# Patient Record
Sex: Male | Born: 1990 | Race: White | Hispanic: No | Marital: Single | Smoking: Never smoker
Health system: Southern US, Community
[De-identification: ages and names within clinical notes are randomized; demographics above are authoritative.]

## PROBLEM LIST (undated history)

## (undated) DIAGNOSIS — F840 Autistic disorder: Secondary | ICD-10-CM

## (undated) DIAGNOSIS — F419 Anxiety disorder, unspecified: Secondary | ICD-10-CM

## (undated) HISTORY — DX: Autistic disorder: F84.0

## (undated) HISTORY — DX: Anxiety disorder, unspecified: F41.9

---

## 2013-01-13 ENCOUNTER — Encounter: Payer: Self-pay | Admitting: Family Medicine

## 2013-01-13 ENCOUNTER — Ambulatory Visit (INDEPENDENT_AMBULATORY_CARE_PROVIDER_SITE_OTHER): Payer: Medicaid Other | Admitting: Family Medicine

## 2013-01-13 VITALS — BP 126/67 | HR 134 | Temp 98.0°F | Ht 70.5 in | Wt 204.0 lb

## 2013-01-13 DIAGNOSIS — H9201 Otalgia, right ear: Secondary | ICD-10-CM

## 2013-01-13 DIAGNOSIS — H9209 Otalgia, unspecified ear: Secondary | ICD-10-CM

## 2013-01-13 DIAGNOSIS — H6123 Impacted cerumen, bilateral: Secondary | ICD-10-CM

## 2013-01-13 DIAGNOSIS — H6120 Impacted cerumen, unspecified ear: Secondary | ICD-10-CM

## 2013-01-13 NOTE — Progress Notes (Signed)
  Subjective:    Patient ID: William Galvan, male    DOB: [birth year], 22 y.o.   MRN: [id number]  HPIpt here today for right ear pain and hard of hearing in right ear. He has had ear discomfort from the ear wax buildup. He comes in today with his grandmother.  There are no active problems to display for this patient.  Outpatient Encounter Prescriptions as of 01/13/2013  Medication Sig Dispense Refill  . ALPRAZolam (XANAX) 0.25 MG tablet Take 0.25 mg by mouth 3 (three) times daily as needed for sleep.       No facility-administered encounter medications on file as of 01/13/2013.       Review of Systems  HENT: Positive for ear pain (right- HOH).    patient denies fever or cough or sore throat     Objective:   Physical Exam  Vitals reviewed. Constitutional: He is oriented to person, place, and time. He appears well-developed and well-nourished. No distress.  HENT:  Head: Normocephalic and atraumatic.  Nose: Nose normal.  Mouth/Throat: Oropharynx is clear and moist. No oropharyngeal exudate.  Bilateral ear cerumen  Eyes: Conjunctivae are normal. Right eye exhibits no discharge. Left eye exhibits no discharge. No scleral icterus.  Neck: Normal range of motion. Neck supple. No thyromegaly present.  Musculoskeletal: Normal range of motion.  Lymphadenopathy:    He has no cervical adenopathy.  Neurological: He is alert and oriented to person, place, and time.  Skin: Skin is warm and dry. No rash noted. He is not diaphoretic.  Psychiatric: He has a normal mood and affect. His behavior is normal.   BP 126/67  Pulse 134  Temp(Src) 98 F (36.7 C) (Oral)  Ht 5' 10.5" (1.791 m)  Wt 204 lb (92.534 kg)  BMI 28.85 kg/m2 Wax was successfully removed with irrigation bilaterally and patient admitted to feeling much better. After removal of ear drops there was no sign of any ear infection or ear canal infection.       Assessment & Plan:    1. Excessive cerumen in ear canal, bilateral   2.  Otalgia of right ear    Patient Instructions  May use debrox ear drops, 3 drops nightly for 2-3 nights wait 1 week and repeat Using these drops in the ear will make it more likely that the wax will drain out instead of plugging the ears   Nyra Capes MD

## 2013-01-13 NOTE — Patient Instructions (Signed)
May use debrox ear drops, 3 drops nightly for 2-3 nights wait 1 week and repeat Using these drops in the ear will make it more likely that the wax will drain out instead of plugging the ears

## 2013-02-08 ENCOUNTER — Ambulatory Visit (INDEPENDENT_AMBULATORY_CARE_PROVIDER_SITE_OTHER): Payer: Medicaid Other

## 2013-02-08 DIAGNOSIS — Z23 Encounter for immunization: Secondary | ICD-10-CM

## 2013-08-25 ENCOUNTER — Encounter: Payer: Self-pay | Admitting: Family Medicine

## 2013-08-25 ENCOUNTER — Ambulatory Visit (INDEPENDENT_AMBULATORY_CARE_PROVIDER_SITE_OTHER): Payer: Medicaid Other | Admitting: Family Medicine

## 2013-08-25 VITALS — BP 118/74 | HR 113 | Temp 100.3°F | Ht 70.5 in | Wt 208.0 lb

## 2013-08-25 DIAGNOSIS — J069 Acute upper respiratory infection, unspecified: Secondary | ICD-10-CM

## 2013-08-25 MED ORDER — AZITHROMYCIN 250 MG PO TABS: ORAL_TABLET | ORAL | Status: DC

## 2013-08-25 NOTE — Progress Notes (Signed)
   Subjective:    Patient ID: William Galvan, male    DOB: 05/10/1990, 23 y.o.   MRN: [id number]  HPI  This 23 y.o. male presents for evaluation of left ear discomfort, decreased hearing acuity, and Low grade fever.  Review of Systems C/o decreased hearing acuity and uri sx's   No chest pain, SOB, HA, dizziness, vision change, N/V, diarrhea, constipation, dysuria, urinary urgency or frequency, myalgias, arthralgias or rash.  Objective:   Physical Exam Vital signs noted  Well developed well nourished male.  HEENT - Head atraumatic Normocephalic                Eyes - PERRLA, Conjuctiva - clear Sclera- Clear EOMI                Ears - EAC's with cerumen impaction.  After irrigation bilat EAC's clear and TM's wnl                Throat - oropharanx wnl Respiratory - Lungs CTA bilateral Cardiac - RRR S1 and S2 without murmur      Assessment & Plan:  URI (upper respiratory infection) - Plan: azithromycin (ZITHROMAX) 250 MG tablet  Push po fluids, rest, tylenol and motrin otc prn as directed for fever, arthralgias, and myalgias.  Follow up prn if sx's continue or persist.  Cerumen impaction - Ears irrigated and clear bilateral.  William CanterWilliam J Daiya Tamer FNP

## 2013-08-25 NOTE — Progress Notes (Signed)
Both ears irrigated until clear and cerumen removed with curette.  Patient tolerated well.

## 2013-11-11 ENCOUNTER — Telehealth: Payer: Self-pay | Admitting: Family Medicine

## 2013-11-11 NOTE — Telephone Encounter (Signed)
appt scheduled for tues with christy

## 2013-11-15 ENCOUNTER — Ambulatory Visit (INDEPENDENT_AMBULATORY_CARE_PROVIDER_SITE_OTHER): Payer: Medicaid Other | Admitting: Family

## 2013-11-15 ENCOUNTER — Encounter: Payer: Self-pay | Admitting: Family

## 2013-11-15 VITALS — BP 114/75 | HR 105 | Temp 98.2°F | Ht 70.5 in | Wt 208.8 lb

## 2013-11-15 DIAGNOSIS — F411 Generalized anxiety disorder: Secondary | ICD-10-CM | POA: Insufficient documentation

## 2013-11-15 MED ORDER — ALPRAZOLAM 0.25 MG PO TABS: 0.2500 mg | ORAL_TABLET | Freq: Three times a day (TID) | ORAL | Status: DC | PRN

## 2013-11-15 MED ORDER — ESCITALOPRAM OXALATE 10 MG PO TABS: 10.0000 mg | ORAL_TABLET | Freq: Every day | ORAL | Status: DC

## 2013-11-15 NOTE — Patient Instructions (Signed)
Stress and Stress Management Stress is a normal reaction to life events. It is what you feel when life demands more than you are used to or more than you can handle. Some stress can be useful. For example, the stress reaction can help you catch the last bus of the day, study for a test, or meet a deadline at work. But stress that occurs too often or for too long can cause problems. It can affect your emotional health and interfere with relationships and normal daily activities. Too much stress can weaken your immune system and increase your risk for physical illness. If you already have a medical problem, stress can make it worse. CAUSES  All sorts of life events may cause stress. An event that causes stress for one person may not be stressful for another person. Major life events commonly cause stress. These may be positive or negative. Examples include losing your job, moving into a new home, getting married, having a baby, or losing a loved one. Less obvious life events may also cause stress, especially if they occur day after day or in combination. Examples include working long hours, driving in traffic, caring for children, being in debt, or being in a difficult relationship. SIGNS AND SYMPTOMS Stress may cause emotional symptoms including, the following:  Anxiety. This is feeling worried, afraid, on edge, overwhelmed, or out of control.  Anger. This is feeling irritated or impatient.  Depression. This is feeling sad, down, helpless, or guilty.  Difficulty focusing, remembering, or making decisions. Stress may cause physical symptoms, including the following:   Aches and pains. These may affect your head, neck, back, stomach, or other areas of your body.  Tight muscles or clenched jaw.  Low energy or trouble sleeping. Stress may cause unhealthy behaviors, including the following:   Eating to feel better (overeating) or skipping meals.  Sleeping too little, too much, or both.  Working  too much or putting off tasks (procrastination).  Smoking, drinking alcohol, or using drugs to feel better. DIAGNOSIS  Stress is diagnosed through an assessment by your health care provider. Your health care provider will ask questions about your symptoms and any stressful life events.Your health care provider will also ask about your medical history and may order blood tests or other tests. Certain medical conditions and medicine can cause physical symptoms similar to stress. Mental illness can cause emotional symptoms and unhealthy behaviors similar to stress. Your health care provider may refer you to a mental health professional for further evaluation.  TREATMENT  Stress management is the recommended treatment for stress.The goals of stress management are reducing stressful life events and coping with stress in healthy ways.  Techniques for reducing stressful life events include the following:  Stress identification. Self-monitor for stress and identify what causes stress for you. These skills may help you to avoid some stressful events.  Time management. Set your priorities, keep a calendar of events, and learn to say "no." These tools can help you avoid making too many commitments. Techniques for coping with stress include the following:  Rethinking the problem. Try to think realistically about stressful events rather than ignoring them or overreacting. Try to find the positives in a stressful situation rather than focusing on the negatives.  Exercise. Physical exercise can release both physical and emotional tension. The key is to find a form of exercise you enjoy and do it regularly.  Relaxation techniques. These relax the body and mind. Examples include yoga, meditation, tai chi, biofeedback, deep  breathing, progressive muscle relaxation, listening to music, being out in nature, journaling, and other hobbies. Again, the key is to find one or more that you enjoy and can do  regularly.  Healthy lifestyle. Eat a balanced diet, get plenty of sleep, and do not smoke. Avoid using alcohol or drugs to relax.  Strong support network. Spend time with family, friends, or other people you enjoy being around.Express your feelings and talk things over with someone you trust. Counseling or talktherapy with a mental health professional may be helpful if you are having difficulty managing stress on your own. Medicine is typically not recommended for the treatment of stress.Talk to your health care provider if you think you need medicine for symptoms of stress. HOME CARE INSTRUCTIONS  Keep all follow-up visits as directed by your health care provider.  Take all medicines as directed by your health care provider. SEEK MEDICAL CARE IF:  Your symptoms get worse or you start having new symptoms.  You feel overwhelmed by your problems and can no longer manage them on your own. SEEK IMMEDIATE MEDICAL CARE IF:  You feel like hurting yourself or someone else. Document Released: 10/01/2000 Document Revised: 08/22/2013 Document Reviewed: 11/30/2012 Exit[employer] Patient Information 2015 [employer]. This information is not intended to replace advice given to you by your health care provider. Make sure you discuss any questions you have with your health care provider.  

## 2013-11-15 NOTE — Progress Notes (Signed)
   Subjective:    Patient ID: William Galvan, male    DOB: 08/02/1990, 23 y.o.   MRN: [id number]  Anxiety Symptoms include compulsions, excessive worry, irritability, nervous/anxious behavior, obsessions, panic, restlessness and shortness of breath. Patient reports no depressed mood, hyperventilation or palpitations. Symptoms occur occasionally. The severity of symptoms is interfering with daily activities and causing significant distress. The symptoms are aggravated by social activities, specific phobias and family issues.   His past medical history is significant for anxiety/panic attacks. There is no history of depression. Past treatments include benzodiazephines. The treatment provided mild relief.      Review of Systems  Constitutional: Positive for irritability.  HENT: Negative.   Respiratory: Positive for shortness of breath.   Cardiovascular: Negative.  Negative for palpitations.  Gastrointestinal: Negative.   Endocrine: Negative.   Genitourinary: Negative.   Musculoskeletal: Negative.   Neurological: Negative.   Hematological: Negative.   Psychiatric/Behavioral: The patient is nervous/anxious.   All other systems reviewed and are negative.      Objective:   Physical Exam  Vitals reviewed. Constitutional: He is oriented to person, place, and time. He appears well-developed and well-nourished. No distress.  HENT:  Head: Normocephalic.  Right Ear: External ear normal.  Left Ear: External ear normal.  Nose: Nose normal.  Mouth/Throat: Oropharynx is clear and moist.  Eyes: Pupils are equal, round, and reactive to light. Right eye exhibits no discharge. Left eye exhibits no discharge.  Neck: Normal range of motion. Neck supple. No thyromegaly present.  Cardiovascular: Normal rate, regular rhythm, normal heart sounds and intact distal pulses.   No murmur heard. Pulmonary/Chest: Effort normal and breath sounds normal. No respiratory distress. He has no wheezes.  Abdominal:  Soft. Bowel sounds are normal. He exhibits no distension. There is no tenderness.  Musculoskeletal: Normal range of motion. He exhibits no edema and no tenderness.  Neurological: He is alert and oriented to person, place, and time. He has normal reflexes. No cranial nerve deficit.  Skin: Skin is warm and dry. No rash noted. No erythema.  Psychiatric: Thought content normal. His mood appears anxious. His speech is delayed. He is hyperactive and withdrawn. He expresses impulsivity.    BP 114/75  Pulse 105  Temp(Src) 98.2 F (36.8 C) (Oral)  Ht 5' 10.5" (1.791 m)  Wt 208 lb 12.8 oz (94.711 kg)  BMI 29.53 kg/m2       Assessment & Plan:  1. GAD (generalized anxiety disorder) -Stress management discussed -RTO in 2 weeks - escitalopram (LEXAPRO) 10 MG tablet; Take 1 tablet (10 mg total) by mouth daily.  Dispense: 30 tablet; Refill: 1 - ALPRAZolam (XANAX) 0.25 MG tablet; Take 1 tablet (0.25 mg total) by mouth 3 (three) times daily as needed for sleep.  Dispense: 60 tablet; Refill: 0  William Rodneyhristy Lamarcus Spira, FNP

## 2013-11-22 ENCOUNTER — Encounter: Payer: Self-pay | Admitting: *Deleted

## 2013-11-29 ENCOUNTER — Ambulatory Visit (INDEPENDENT_AMBULATORY_CARE_PROVIDER_SITE_OTHER): Payer: Medicaid Other | Admitting: Family

## 2013-11-29 ENCOUNTER — Encounter: Payer: Self-pay | Admitting: Family

## 2013-11-29 VITALS — BP 122/74 | HR 95 | Temp 97.4°F | Ht 70.0 in | Wt 203.0 lb

## 2013-11-29 DIAGNOSIS — F411 Generalized anxiety disorder: Secondary | ICD-10-CM

## 2013-11-29 MED ORDER — ESCITALOPRAM OXALATE 20 MG PO TABS: 20.0000 mg | ORAL_TABLET | Freq: Every day | ORAL | Status: DC

## 2013-11-29 NOTE — Progress Notes (Signed)
   Subjective:    Patient ID: William Galvan, male    DOB: 12/26/1990, 23 y.o.   MRN: [id number]  HPI Pt presents to the office for follow-up for anxiety. Pt is brought by his mother. Pt has some developmental problems. Pt states his anxiety it better and he is doing "good'. Pt's mother states she can tell a difference, but states he is still anxious at times. Pt states he has been exercising and trying to lose weight. Pt has lost 5 lbs since last visit. He is very happy with that!   Review of Systems  Constitutional: Negative.   HENT: Negative.   Respiratory: Negative.   Cardiovascular: Negative.   Gastrointestinal: Negative.   Endocrine: Negative.   Genitourinary: Negative.   Musculoskeletal: Negative.   Neurological: Negative.   Hematological: Negative.   Psychiatric/Behavioral: Negative.   All other systems reviewed and are negative.      Objective:   Physical Exam  Vitals reviewed. Constitutional: He is oriented to person, place, and time. He appears well-developed and well-nourished. No distress.  HENT:  Head: Normocephalic.  Right Ear: External ear normal.  Left Ear: External ear normal.  Mouth/Throat: Oropharynx is clear and moist.  Eyes: Pupils are equal, round, and reactive to light. Right eye exhibits no discharge. Left eye exhibits no discharge.  Neck: Normal range of motion. Neck supple. No thyromegaly present.  Cardiovascular: Normal rate, regular rhythm, normal heart sounds and intact distal pulses.   No murmur heard. Pulmonary/Chest: Effort normal and breath sounds normal. No respiratory distress. He has no wheezes.  Abdominal: Soft. Bowel sounds are normal. He exhibits no distension. There is no tenderness.  Musculoskeletal: Normal range of motion. He exhibits no edema and no tenderness.  Neurological: He is alert and oriented to person, place, and time. He has normal reflexes. No cranial nerve deficit.  Skin: Skin is warm and dry. No rash noted. No erythema.    Psychiatric: He has a normal mood and affect. His behavior is normal. Judgment and thought content normal.     BP 122/74  Pulse 95  Temp(Src) 97.4 F (36.3 C) (Oral)  Ht 5\' 10"  (1.778 m)  Wt 203 lb (92.08 kg)  BMI 29.13 kg/m2      Assessment & Plan:  1. GAD (generalized anxiety disorder) -Stress management -RTO 6 months or prn - escitalopram (LEXAPRO) 20 MG tablet; Take 1 tablet (20 mg total) by mouth daily.  Dispense: 90 tablet; Refill: 3  Jannifer Rodneyhristy Valgene Deloatch, FNP

## 2013-11-29 NOTE — Patient Instructions (Signed)
Generalized Anxiety Disorder Generalized anxiety disorder (GAD) is a mental disorder. It interferes with life functions, including relationships, work, and school. GAD is different from normal anxiety, which everyone experiences at some point in their lives in response to specific life events and activities. Normal anxiety actually helps us prepare for and get through these life events and activities. Normal anxiety goes away after the event or activity is over.  GAD causes anxiety that is not necessarily related to specific events or activities. It also causes excess anxiety in proportion to specific events or activities. The anxiety associated with GAD is also difficult to control. GAD can vary from mild to severe. People with severe GAD can have intense waves of anxiety with physical symptoms (panic attacks).  SYMPTOMS The anxiety and worry associated with GAD are difficult to control. This anxiety and worry are related to many life events and activities and also occur more days than not for 6 months or longer. People with GAD also have three or more of the following symptoms (one or more in children):  Restlessness.   Fatigue.  Difficulty concentrating.   Irritability.  Muscle tension.  Difficulty sleeping or unsatisfying sleep. DIAGNOSIS GAD is diagnosed through an assessment by your health care provider. Your health care provider will ask you questions aboutyour mood,physical symptoms, and events in your life. Your health care provider may ask you about your medical history and use of alcohol or drugs, including prescription medicines. Your health care provider may also do a physical exam and blood tests. Certain medical conditions and the use of certain substances can cause symptoms similar to those associated with GAD. Your health care provider may refer you to a mental health specialist for further evaluation. TREATMENT The following therapies are usually used to treat GAD:    Medication. Antidepressant medication usually is prescribed for long-term daily control. Antianxiety medicines may be added in severe cases, especially when panic attacks occur.   Talk therapy (psychotherapy). Certain types of talk therapy can be helpful in treating GAD by providing support, education, and guidance. A form of talk therapy called cognitive behavioral therapy can teach you healthy ways to think about and react to daily life events and activities.  Stress managementtechniques. These include yoga, meditation, and exercise and can be very helpful when they are practiced regularly. A mental health specialist can help determine which treatment is best for you. Some people see improvement with one therapy. However, other people require a combination of therapies. Document Released: 08/02/2012 Document Revised: 08/22/2013 Document Reviewed: 08/02/2012 [employer] Patient Information 2015 [employer]. This information is not intended to replace advice given to you by your health care provider. Make sure you discuss any questions you have with your health care provider.  

## 2014-02-02 ENCOUNTER — Ambulatory Visit (INDEPENDENT_AMBULATORY_CARE_PROVIDER_SITE_OTHER): Payer: Medicaid Other

## 2014-02-02 ENCOUNTER — Ambulatory Visit: Payer: Medicaid Other

## 2014-02-02 DIAGNOSIS — Z23 Encounter for immunization: Secondary | ICD-10-CM

## 2014-05-26 ENCOUNTER — Ambulatory Visit (INDEPENDENT_AMBULATORY_CARE_PROVIDER_SITE_OTHER): Payer: Medicaid Other | Admitting: Family Medicine

## 2014-05-26 VITALS — BP 129/75 | HR 98 | Temp 98.0°F | Ht 70.0 in | Wt 210.0 lb

## 2014-05-26 DIAGNOSIS — H6121 Impacted cerumen, right ear: Secondary | ICD-10-CM

## 2014-05-26 NOTE — Progress Notes (Signed)
   Subjective:    Patient ID: William Galvan, male    DOB: 01/23/1991, 24 y.o.   MRN: [id number]  HPI C/o left ear pain and discomfort  Review of Systems  Constitutional: Negative for fever.  HENT: Negative for ear pain.   Eyes: Negative for discharge.  Respiratory: Negative for cough.   Cardiovascular: Negative for chest pain.  Gastrointestinal: Negative for abdominal distention.  Endocrine: Negative for polyuria.  Genitourinary: Negative for difficulty urinating.  Musculoskeletal: Negative for gait problem and neck pain.  Skin: Negative for color change and rash.  Neurological: Negative for speech difficulty and headaches.  Psychiatric/Behavioral: Negative for agitation.       Objective:    BP 129/75 mmHg  Pulse 98  Temp(Src) 98 F (36.7 C) (Oral)  Ht 5\' 10"  (1.778 m)  Wt 210 lb (95.255 kg)  BMI 30.13 kg/m2 Physical Exam  Constitutional: He is oriented to person, place, and time. He appears well-developed and well-nourished.  HENT:  Head: Normocephalic and atraumatic.  Mouth/Throat: Oropharynx is clear and moist.  Right ear cerumen impaction and TM normal after irrigation  Eyes: Pupils are equal, round, and reactive to light.  Neck: Normal range of motion. Neck supple.  Cardiovascular: Normal rate and regular rhythm.   No murmur heard. Pulmonary/Chest: Effort normal and breath sounds normal.  Abdominal: Soft. Bowel sounds are normal. There is no tenderness.  Neurological: He is alert and oriented to person, place, and time.  Skin: Skin is warm and dry.  Psychiatric: He has a normal mood and affect.          Assessment & Plan:     ICD-9-CM ICD-10-CM   1. Cerumen impaction, right 380.4 H61.21     Irrigate right ear No Follow-up on file.  Deatra CanterWilliam J Oxford FNP

## 2014-06-02 ENCOUNTER — Encounter: Payer: Self-pay | Admitting: Family

## 2014-06-02 ENCOUNTER — Ambulatory Visit (INDEPENDENT_AMBULATORY_CARE_PROVIDER_SITE_OTHER): Payer: Medicaid Other | Admitting: Family

## 2014-06-02 VITALS — BP 121/70 | HR 96 | Temp 97.6°F | Ht 70.0 in | Wt 212.8 lb

## 2014-06-02 DIAGNOSIS — F411 Generalized anxiety disorder: Secondary | ICD-10-CM

## 2014-06-02 DIAGNOSIS — R5383 Other fatigue: Secondary | ICD-10-CM

## 2014-06-02 DIAGNOSIS — Z0000 Encounter for general adult medical examination without abnormal findings: Secondary | ICD-10-CM

## 2014-06-02 MED ORDER — ESCITALOPRAM OXALATE 20 MG PO TABS: 20.0000 mg | ORAL_TABLET | Freq: Every day | ORAL | Status: DC

## 2014-06-02 MED ORDER — ALPRAZOLAM 0.25 MG PO TABS: 0.2500 mg | ORAL_TABLET | Freq: Three times a day (TID) | ORAL | Status: DC | PRN

## 2014-06-02 NOTE — Progress Notes (Signed)
Subjective:    Patient ID: Halley Kincer, male    DOB: Oct 20, 1990, 24 y.o.   MRN: [id number]  Anxiety Presents for follow-up visit. Symptoms include excessive worry, nervous/anxious behavior, panic and restlessness. Patient reports no confusion, depressed mood, dizziness, feeling of choking, impotence, insomnia or irritability. Symptoms occur occasionally. The severity of symptoms is moderate.   His past medical history is significant for anxiety/panic attacks. There is no history of CAD or depression.      Review of Systems  Constitutional: Negative.  Negative for irritability.  HENT: Negative.   Respiratory: Negative.   Cardiovascular: Negative.   Gastrointestinal: Negative.   Endocrine: Negative.   Genitourinary: Negative.  Negative for impotence.  Musculoskeletal: Negative.   Neurological: Negative.  Negative for dizziness.  Hematological: Negative.   Psychiatric/Behavioral: Negative for confusion. The patient is nervous/anxious. The patient does not have insomnia.   All other systems reviewed and are negative.      Objective:   Physical Exam  Constitutional: He is oriented to person, place, and time. He appears well-developed and well-nourished. No distress.  HENT:  Head: Normocephalic.  Right Ear: External ear normal.  Left Ear: External ear normal.  Nose: Nose normal.  Mouth/Throat: Oropharynx is clear and moist.  Eyes: Pupils are equal, round, and reactive to light. Right eye exhibits no discharge. Left eye exhibits no discharge.  Neck: Normal range of motion. Neck supple. No thyromegaly present.  Cardiovascular: Normal rate, regular rhythm, normal heart sounds and intact distal pulses.   No murmur heard. Pulmonary/Chest: Effort normal and breath sounds normal. No respiratory distress. He has no wheezes.  Abdominal: Soft. Bowel sounds are normal. He exhibits no distension. There is no tenderness.  Musculoskeletal: Normal range of motion. He exhibits no edema or  tenderness.  Neurological: He is alert and oriented to person, place, and time. He has normal reflexes. No cranial nerve deficit.  Skin: Skin is warm and dry. No rash noted. No erythema.  Psychiatric: He has a normal mood and affect. His behavior is normal. Judgment and thought content normal.  Vitals reviewed.     Blood pressure 121/70, pulse 96, temperature 97.6 F (36.4 C), temperature source Oral, height 5' 10" (1.778 m), weight 212 lb 12.8 oz (96.525 kg). s    Assessment & Plan:  1. GAD (generalized anxiety disorder) -Stress management discussed  escitalopram (LEXAPRO) 20 MG tablet; Take 1 tablet (20 mg total) by mouth daily.  Dispense: 90 tablet; Refill: 3 - ALPRAZolam (XANAX) 0.25 MG tablet; Take 1 tablet (0.25 mg total) by mouth 3 (three) times daily as needed for sleep.  Dispense: 60 tablet; Refill: 3  2. Annual physical exam - CMP14+EGFR - Lipid panel - Thyroid Panel With TSH - Vit D  25 hydroxy (rtn osteoporosis monitoring)  3. Other fatigue - CMP14+EGFR - Thyroid Panel With TSH - Vit D  25 hydroxy (rtn osteoporosis monitoring)   Continue all meds Labs pending Health Maintenance reviewed Diet and exercise encouraged RTO 6 months  Evelina Dun, FNP

## 2014-06-02 NOTE — Patient Instructions (Signed)
Generalized Anxiety Disorder Generalized anxiety disorder (GAD) is a mental disorder. It interferes with life functions, including relationships, work, and school. GAD is different from normal anxiety, which everyone experiences at some point in their lives in response to specific life events and activities. Normal anxiety actually helps us prepare for and get through these life events and activities. Normal anxiety goes away after the event or activity is over.  GAD causes anxiety that is not necessarily related to specific events or activities. It also causes excess anxiety in proportion to specific events or activities. The anxiety associated with GAD is also difficult to control. GAD can vary from mild to severe. People with severe GAD can have intense waves of anxiety with physical symptoms (panic attacks).  SYMPTOMS The anxiety and worry associated with GAD are difficult to control. This anxiety and worry are related to many life events and activities and also occur more days than not for 6 months or longer. People with GAD also have three or more of the following symptoms (one or more in children):  Restlessness.   Fatigue.  Difficulty concentrating.   Irritability.  Muscle tension.  Difficulty sleeping or unsatisfying sleep. DIAGNOSIS GAD is diagnosed through an assessment by your health care provider. Your health care provider will ask you questions aboutyour mood,physical symptoms, and events in your life. Your health care provider may ask you about your medical history and use of alcohol or drugs, including prescription medicines. Your health care provider may also do a physical exam and blood tests. Certain medical conditions and the use of certain substances can cause symptoms similar to those associated with GAD. Your health care provider may refer you to a mental health specialist for further evaluation. TREATMENT The following therapies are usually used to treat GAD:    Medication. Antidepressant medication usually is prescribed for long-term daily control. Antianxiety medicines may be added in severe cases, especially when panic attacks occur.   Talk therapy (psychotherapy). Certain types of talk therapy can be helpful in treating GAD by providing support, education, and guidance. A form of talk therapy called cognitive behavioral therapy can teach you healthy ways to think about and react to daily life events and activities.  Stress managementtechniques. These include yoga, meditation, and exercise and can be very helpful when they are practiced regularly. A mental health specialist can help determine which treatment is best for you. Some people see improvement with one therapy. However, other people require a combination of therapies. Document Released: 08/02/2012 Document Revised: 08/22/2013 Document Reviewed: 08/02/2012 [employer] Patient Information 2015 [employer]. This information is not intended to replace advice given to you by your health care provider. Make sure you discuss any questions you have with your health care provider.  

## 2014-06-03 LAB — CMP14+EGFR
ALBUMIN: 4.6 g/dL (ref 3.5–5.5)
ALT: 23 IU/L (ref 0–44)
AST: 24 IU/L (ref 0–40)
Albumin/Globulin Ratio: 1.9 (ref 1.1–2.5)
Alkaline Phosphatase: 76 IU/L (ref 39–117)
BUN / CREAT RATIO: 14 (ref 8–19)
BUN: 13 mg/dL (ref 6–20)
Bilirubin Total: 0.5 mg/dL (ref 0.0–1.2)
CO2: 25 mmol/L (ref 18–29)
Calcium: 9.3 mg/dL (ref 8.7–10.2)
Chloride: 97 mmol/L (ref 97–108)
Creatinine, Ser: 0.9 mg/dL (ref 0.76–1.27)
GFR, EST AFRICAN AMERICAN: 137 mL/min/{1.73_m2} (ref >59)
GFR, EST NON AFRICAN AMERICAN: 118 mL/min/{1.73_m2} (ref >59)
Globulin, Total: 2.4 g/dL (ref 1.5–4.5)
Glucose: 92 mg/dL (ref 65–99)
Potassium: 4.1 mmol/L (ref 3.5–5.2)
Sodium: 140 mmol/L (ref 134–144)
Total Protein: 7 g/dL (ref 6.0–8.5)

## 2014-06-03 LAB — LIPID PANEL
CHOLESTEROL TOTAL: 191 mg/dL (ref 100–199)
Chol/HDL Ratio: 4.7 ratio units (ref 0.0–5.0)
HDL: 41 mg/dL (ref >39)
LDL Calculated: 95 mg/dL (ref 0–99)
Triglycerides: 277 mg/dL — ABNORMAL HIGH (ref 0–149)
VLDL Cholesterol Cal: 55 mg/dL — ABNORMAL HIGH (ref 5–40)

## 2014-06-03 LAB — THYROID PANEL WITH TSH
Free Thyroxine Index: 2.6 (ref 1.2–4.9)
T3 Uptake Ratio: 28 % (ref 24–39)
T4, Total: 9.2 ug/dL (ref 4.5–12.0)
TSH: 2.31 u[IU]/mL (ref 0.450–4.500)

## 2014-06-03 LAB — VITAMIN D 25 HYDROXY (VIT D DEFICIENCY, FRACTURES): VIT D 25 HYDROXY: 29.1 ng/mL — AB (ref 30.0–100.0)

## 2014-06-04 ENCOUNTER — Other Ambulatory Visit: Payer: Self-pay | Admitting: Family

## 2014-06-04 DIAGNOSIS — E559 Vitamin D deficiency, unspecified: Secondary | ICD-10-CM

## 2014-06-04 MED ORDER — VITAMIN D (ERGOCALCIFEROL) 1.25 MG (50000 UNIT) PO CAPS: 50000.0000 [IU] | ORAL_CAPSULE | ORAL | Status: DC

## 2014-06-05 ENCOUNTER — Telehealth: Payer: Self-pay | Admitting: *Deleted

## 2014-06-05 NOTE — Telephone Encounter (Signed)
Pt's grandmother notified of results Verbalizes understanding

## 2014-06-05 NOTE — Telephone Encounter (Signed)
-----   Message from Junie Spencerhristy A Hawks, FNP sent at 06/04/2014  5:58 PM EST ----- Kidney and liver function stable Triglycerides elevated- Pt needs to be on low fat diet Thyroid levels WNL Vit D levels low-Prescription sent to pharmacy

## 2014-10-27 ENCOUNTER — Encounter: Payer: Self-pay | Admitting: *Deleted

## 2014-12-01 ENCOUNTER — Ambulatory Visit (INDEPENDENT_AMBULATORY_CARE_PROVIDER_SITE_OTHER): Payer: Medicaid Other | Admitting: Family

## 2014-12-01 ENCOUNTER — Encounter: Payer: Self-pay | Admitting: Family

## 2014-12-01 VITALS — BP 116/76 | HR 101 | Temp 98.1°F | Ht 70.0 in | Wt 215.0 lb

## 2014-12-01 DIAGNOSIS — F411 Generalized anxiety disorder: Secondary | ICD-10-CM | POA: Diagnosis not present

## 2014-12-01 DIAGNOSIS — F840 Autistic disorder: Secondary | ICD-10-CM

## 2014-12-01 DIAGNOSIS — E559 Vitamin D deficiency, unspecified: Secondary | ICD-10-CM

## 2014-12-01 MED ORDER — ESCITALOPRAM OXALATE 20 MG PO TABS: 20.0000 mg | ORAL_TABLET | Freq: Every day | ORAL | Status: DC

## 2014-12-01 MED ORDER — ALPRAZOLAM 0.5 MG PO TABS: 0.5000 mg | ORAL_TABLET | Freq: Two times a day (BID) | ORAL | Status: DC | PRN

## 2014-12-01 NOTE — Patient Instructions (Signed)

## 2014-12-01 NOTE — Progress Notes (Signed)
   Subjective:    Patient ID: William Galvan, male    DOB: 08-Jun-1990, 24 y.o.   MRN: [id number]  Pt presents to the office today for chronic follow up. Pt is autistic and his mother cares for him. Mother states she feels as if his GAD is worse. Mother states it seems as if he is crying more. Pt states he goes a "few days without taking his Lexapro".  Anxiety Presents for follow-up visit. Onset was 1 to 6 months ago. The problem has been waxing and waning. Symptoms include depressed mood, excessive worry, insomnia, irritability, nervous/anxious behavior and panic. Symptoms occur most days. The severity of symptoms is moderate. The symptoms are aggravated by family issues.   His past medical history is significant for anxiety/panic attacks and depression. Past treatments include SSRIs and benzodiazephines. The treatment provided mild relief. Compliance with prior treatments has been good.      Review of Systems  Constitutional: Positive for irritability.  HENT: Negative.   Respiratory: Negative.   Cardiovascular: Negative.   Gastrointestinal: Negative.   Endocrine: Negative.   Genitourinary: Negative.   Musculoskeletal: Negative.   Neurological: Negative.   Hematological: Negative.   Psychiatric/Behavioral: The patient is nervous/anxious and has insomnia.   All other systems reviewed and are negative.      Objective:   Physical Exam  Constitutional: He is oriented to person, place, and time. He appears well-developed and well-nourished. No distress.  HENT:  Head: Normocephalic.  Right Ear: External ear normal.  Left Ear: External ear normal.  Nose: Nose normal.  Mouth/Throat: Oropharynx is clear and moist.  Eyes: Pupils are equal, round, and reactive to light. Right eye exhibits no discharge. Left eye exhibits no discharge.  Neck: Normal range of motion. Neck supple. No thyromegaly present.  Cardiovascular: Normal rate, regular rhythm, normal heart sounds and intact distal pulses.    No murmur heard. Pulmonary/Chest: Effort normal and breath sounds normal. No respiratory distress. He has no wheezes.  Abdominal: Soft. Bowel sounds are normal. He exhibits no distension. There is no tenderness.  Musculoskeletal: Normal range of motion. He exhibits no edema or tenderness.  Neurological: He is alert and oriented to person, place, and time. He has normal reflexes. No cranial nerve deficit.  Skin: Skin is warm and dry. No rash noted. No erythema.  Psychiatric: He has a normal mood and affect. His behavior is normal. Judgment and thought content normal.  Vitals reviewed.    BP 116/76 mmHg  Pulse 101  Temp(Src) 98.1 F (36.7 C) (Oral)  Ht  (1.778 m)  Wt 215 lb (97.523 kg)  BMI 30.85 kg/m2      Assessment & Plan:  1. Vitamin D deficiency  2. GAD (generalized anxiety disorder) -Stress management discussed - ALPRAZolam (XANAX) 0.5 MG tablet; Take 1 tablet (0.5 mg total) by mouth 2 (two) times daily as needed for anxiety.  Dispense: 60 tablet; Refill: 2 - escitalopram (LEXAPRO) 20 MG tablet; Take 1 tablet (20 mg total) by mouth daily.  Dispense: 90 tablet; Refill: 3  3. Autistic disorder, active   Continue all meds Health Maintenance reviewed Diet and exercise encouraged RTO 6 months   Jannifer Rodney, FNP

## 2015-01-08 ENCOUNTER — Encounter: Payer: Self-pay | Admitting: *Deleted

## 2015-01-31 ENCOUNTER — Ambulatory Visit (INDEPENDENT_AMBULATORY_CARE_PROVIDER_SITE_OTHER): Payer: Medicaid Other

## 2015-01-31 DIAGNOSIS — Z23 Encounter for immunization: Secondary | ICD-10-CM

## 2015-05-03 ENCOUNTER — Ambulatory Visit (INDEPENDENT_AMBULATORY_CARE_PROVIDER_SITE_OTHER): Payer: Medicaid Other | Admitting: Family Medicine

## 2015-05-03 ENCOUNTER — Encounter: Payer: Self-pay | Admitting: Family Medicine

## 2015-05-03 VITALS — BP 126/72 | HR 90 | Temp 96.9°F | Ht 70.0 in | Wt 207.6 lb

## 2015-05-03 DIAGNOSIS — F840 Autistic disorder: Secondary | ICD-10-CM | POA: Diagnosis not present

## 2015-05-03 DIAGNOSIS — F411 Generalized anxiety disorder: Secondary | ICD-10-CM | POA: Diagnosis not present

## 2015-05-03 DIAGNOSIS — Z0000 Encounter for general adult medical examination without abnormal findings: Secondary | ICD-10-CM | POA: Diagnosis not present

## 2015-05-03 MED ORDER — ESCITALOPRAM OXALATE 20 MG PO TABS: 20.0000 mg | ORAL_TABLET | Freq: Every day | ORAL | Status: DC

## 2015-05-03 MED ORDER — LORAZEPAM 0.5 MG PO TABS: 0.5000 mg | ORAL_TABLET | Freq: Two times a day (BID) | ORAL | Status: DC | PRN

## 2015-05-03 NOTE — Progress Notes (Signed)
   HPI  Patient presents today annual physical exam and to discuss anxiety.  He reports good health, he has autism. He denies any shortness of breath, dyspnea, palpitations, leg edema. He likes to play video games on his computer and phone. He does not really exercise.  Anxiety No concerns about depression or suicidal ideation. Sometimes he gets "mean" he takes Xanax at these times, his mother wrote me a note that states that she feels actually gets more anxious with Xanax. He has continued Lexapro daily. He states he only takes Xanax maybe once a week, he's only taking one dose this week. His sister has schizophrenia and is seen at [facility] in [place]. Wonder he needs psychiatry.  PMH: Smoking status noted His past medical, surgical, social, family history updated in EMR ROS: Per HPI  Objective: BP 126/72 mmHg  Pulse 90  Temp(Src) 96.9 F (36.1 C) (Oral)  Ht '5\' 10"'$  (1.778 m)  Wt 207 lb 9.6 oz (94.167 kg)  BMI 29.79 kg/m2 Gen: NAD, alert, cooperative with exam HEENT: NCAT, moist mucous membranes, TMs normal bilaterally, nares clear, PERRLA CV: RRR, good S1/S2, no murmur Resp: CTABL, no wheezes, non-labored Abd: SNTND, BS present, no guarding or organomegaly Ext: No edema, warm Neuro: Alert and oriented, 2+ patellar tendon reflexes bilaterally, strength 5/5 and sensation intact in bilateral lower extremities Skin: Some very mild scaling of the skin anterior to the left ear  Assessment and plan:  # Physical Exam Normal physical exam, autism Labs with chronic SSRI use  # Anxiety Mother feels that Xanax causes increased anxiety, and change this to Ativan, however I believe that he probably needs an atypical antipsychotic as adjuvant therapy with his SSRI. I've recommended that he see psychiatry, referral written His sister has an established psychiatrist, I recommended a day ago to the same place No SI    Orders Placed This Encounter  Procedures  . CMP14+EGFR    . CBC with Differential  . Ambulatory referral to Psychiatry    Referral Priority:  Routine    Referral Type:  Psychiatric    Referral Reason:  Specialty Services Required    Requested Specialty:  Psychiatry    Number of Visits Requested:  1    Meds ordered this encounter  Medications  . DISCONTD: Ergocalciferol (VITAMIN D2 PO)    Sig: Take 1.25 mg by mouth.  . Vitamin D, Ergocalciferol, (DRISDOL) 50000 units CAPS capsule    Sig: TAKE 1 CAPSULE (50,000 UNITS TOTAL) BY MOUTH EVERY 7 (SEVEN) DAYS.    Refill:  3  . LORazepam (ATIVAN) 0.5 MG tablet    Sig: Take 1 tablet (0.5 mg total) by mouth 2 (two) times daily as needed for anxiety.    Dispense:  30 tablet    Refill:  1  . escitalopram (LEXAPRO) 20 MG tablet    Sig: Take 1 tablet (20 mg total) by mouth daily.    Dispense:  90 tablet    Refill:  [place], MD [facility] 05/03/2015, 10:28 AM

## 2015-05-03 NOTE — Patient Instructions (Signed)
Great to meet you guys!  Arrange a psychiatry appointment, I agree he probably needs different medications and an in depth evaluation  Stop xanax, try ativan. Use these only as needed.   Continue lexapro

## 2015-05-04 ENCOUNTER — Telehealth: Payer: Self-pay | Admitting: Family Medicine

## 2015-05-04 LAB — CMP14+EGFR
ALK PHOS: 89 IU/L (ref 39–117)
ALT: 19 IU/L (ref 0–44)
AST: 20 IU/L (ref 0–40)
Albumin/Globulin Ratio: 2.1 (ref 1.1–2.5)
Albumin: 4.8 g/dL (ref 3.5–5.5)
BUN/Creatinine Ratio: 9 (ref 8–19)
BUN: 8 mg/dL (ref 6–20)
Bilirubin Total: 0.6 mg/dL (ref 0.0–1.2)
CO2: 24 mmol/L (ref 18–29)
Calcium: 9.5 mg/dL (ref 8.7–10.2)
Chloride: 98 mmol/L (ref 96–106)
Creatinine, Ser: 0.91 mg/dL (ref 0.76–1.27)
GFR calc Af Amer: 134 mL/min/{1.73_m2} (ref >59)
GFR calc non Af Amer: 116 mL/min/{1.73_m2} (ref >59)
GLUCOSE: 98 mg/dL (ref 65–99)
Globulin, Total: 2.3 g/dL (ref 1.5–4.5)
Potassium: 4 mmol/L (ref 3.5–5.2)
Sodium: 139 mmol/L (ref 134–144)
Total Protein: 7.1 g/dL (ref 6.0–8.5)

## 2015-05-04 LAB — CBC WITH DIFFERENTIAL/PLATELET
BASOS ABS: 0 10*3/uL (ref 0.0–0.2)
Basos: 0 %
EOS (ABSOLUTE): 0.1 10*3/uL (ref 0.0–0.4)
Eos: 1 %
HEMOGLOBIN: 15.4 g/dL (ref 12.6–17.7)
Hematocrit: 43.5 % (ref 37.5–51.0)
IMMATURE GRANULOCYTES: 0 %
Immature Grans (Abs): 0 10*3/uL (ref 0.0–0.1)
Lymphocytes Absolute: 1.9 10*3/uL (ref 0.7–3.1)
Lymphs: 22 %
MCH: 31.2 pg (ref 26.6–33.0)
MCHC: 35.4 g/dL (ref 31.5–35.7)
MCV: 88 fL (ref 79–97)
MONOCYTES: 10 %
Monocytes Absolute: 0.9 10*3/uL (ref 0.1–0.9)
Neutrophils Absolute: 5.8 10*3/uL (ref 1.4–7.0)
Neutrophils: 67 %
Platelets: 279 10*3/uL (ref 150–379)
RBC: 4.93 x10E6/uL (ref 4.14–5.80)
RDW: 13.4 % (ref 12.3–15.4)
WBC: 8.6 10*3/uL (ref 3.4–10.8)

## 2015-05-04 NOTE — Progress Notes (Signed)
Patients mother aware  

## 2015-05-29 ENCOUNTER — Encounter: Payer: Self-pay | Admitting: Family Medicine

## 2015-05-29 ENCOUNTER — Ambulatory Visit (INDEPENDENT_AMBULATORY_CARE_PROVIDER_SITE_OTHER): Payer: Medicaid Other | Admitting: Family Medicine

## 2015-05-29 VITALS — BP 122/78 | HR 113 | Temp 97.8°F | Ht 70.0 in | Wt 210.8 lb

## 2015-05-29 DIAGNOSIS — H6123 Impacted cerumen, bilateral: Secondary | ICD-10-CM

## 2015-05-29 NOTE — Patient Instructions (Addendum)
Great to see you   Do not use Q tips Use Derbrox or mineral oil a  Few times a week

## 2015-05-29 NOTE — Progress Notes (Signed)
   HPI  Patient presents today with complaints of ear wax buildup.  Patient explains that over the last few days he's had difficulty hear out of his left ear. His  Right ear feels normal.  He denies any cough or cold symptoms. He is breathing easily and tolerating foods and fluids normally.  They have a psychiatrist appointment set up for 2 weeks away.  PMH: Smoking status noted ROS: Per HPI  Objective: BP 122/78 mmHg  Pulse 113  Temp(Src) 97.8 F (36.6 C) (Oral)  Ht  (1.778 m)  Wt 210 lb 12.8 oz (95.618 kg)  BMI 30.25 kg/m2 Gen: NAD, alert, cooperative with exam HEENT: NCAT, bilateral ears with impacted cerumen CV: RRR, good S1/S2, no murmur Resp: CTABL, no wheezes, non-labored Ext: No edema, warm Neuro: Alert and oriented, No gross deficits  Assessment and plan:  # cerumen impaction Cleaned out today by nursing with good effect No signs of AOM Discussed supportive care RTC as needed    Murtis Sink, MD [facility] 05/29/2015, 1:08 PM

## 2015-06-01 ENCOUNTER — Ambulatory Visit: Payer: Medicaid Other | Admitting: Family

## 2015-09-10 ENCOUNTER — Ambulatory Visit: Payer: Medicaid Other | Admitting: Family Medicine

## 2015-12-10 ENCOUNTER — Ambulatory Visit (INDEPENDENT_AMBULATORY_CARE_PROVIDER_SITE_OTHER): Payer: Medicaid Other | Admitting: Nurse Practitioner

## 2015-12-10 ENCOUNTER — Encounter: Payer: Self-pay | Admitting: Nurse Practitioner

## 2015-12-10 VITALS — BP 111/71 | HR 110 | Temp 99.2°F | Ht 70.0 in | Wt 223.2 lb

## 2015-12-10 DIAGNOSIS — H6123 Impacted cerumen, bilateral: Secondary | ICD-10-CM

## 2015-12-10 NOTE — Progress Notes (Signed)
   Subjective:    Patient ID: William Galvan, male    DOB: 05/02/1990, 25 y.o.   MRN: [id number]  HPI Patient brought in by mom with c/o bil ear pain. He has been complaing for several days and has gotten no better. He went swimming last Wednesday and seem to start soon after that.     Review of Systems  Constitutional: Negative.  Negative for chills and fever.  HENT: Positive for ear pain (bill). Negative for congestion and ear discharge.   Respiratory: Negative.   Cardiovascular: Negative.   Genitourinary: Negative.   Neurological: Negative.   Psychiatric/Behavioral: Negative.        Objective:   Physical Exam  Constitutional: He is oriented to person, place, and time. He appears well-developed and well-nourished. No distress.  HENT:  Right Ear: External ear normal. A foreign body (cerumen impaction) is present.  Left Ear: External ear normal. A foreign body (cerumen impaction) is present.  Nose: Nose normal.  Mouth/Throat: Oropharynx is clear and moist.  Cardiovascular: Normal rate, regular rhythm and normal heart sounds.   Pulmonary/Chest: Effort normal and breath sounds normal.  Neurological: He is alert and oriented to person, place, and time.  Skin: Skin is warm.  Psychiatric: He has a normal mood and affect. His behavior is normal. Judgment and thought content normal.   BP 111/71 (BP Location: Right Arm, Patient Position: Sitting, Cuff Size: Large)   Pulse (!) 110   Temp 99.2 F (37.3 C) (Oral)   Ht 5\' 10"  (1.778 m)   Wt 223 lb 3.2 oz (101.2 kg)   BMI 32.03 kg/m   bil ear irrigation- TMS clear bil       Assessment & Plan:   1. Cerumen impaction, bilateral    contiune deprox daily Follow up prn  Mary-Margaret Daphine DeutscherMartin, FNP

## 2015-12-10 NOTE — Patient Instructions (Signed)
Cerumen Impaction The structures of the external ear canal secrete a waxy substance known as cerumen. Excess cerumen can build up in the ear canal, causing a condition known as cerumen impaction. Cerumen impaction can cause ear pain and disrupt the function of the ear. The rate of cerumen production differs for each individual. In certain individuals, the configuration of the ear canal may decrease his or her ability to naturally remove cerumen. CAUSES Cerumen impaction is caused by excessive cerumen production or buildup. RISK FACTORS  Frequent use of swabs to clean ears.  Having narrow ear canals.  Having eczema.  Being dehydrated. SIGNS AND SYMPTOMS  Diminished hearing.  Ear drainage.  Ear pain.  Ear itch. TREATMENT Treatment may involve:  Over-the-counter or prescription ear drops to soften the cerumen.  Removal of cerumen by a health care provider. This may be done with:  Irrigation with warm water. This is the most common method of removal.  Ear curettes and other instruments.  Surgery. This may be done in severe cases. HOME CARE INSTRUCTIONS  Take medicines only as directed by your health care provider.  Do not insert objects into the ear with the intent of cleaning the ear. PREVENTION  Do not insert objects into the ear, even with the intent of cleaning the ear. Removing cerumen as a part of normal hygiene is not necessary, and the use of swabs in the ear canal is not recommended.  Drink enough water to keep your urine clear or pale yellow.  Control your eczema if you have it. SEEK MEDICAL CARE IF:  You develop ear pain.  You develop bleeding from the ear.  The cerumen does not clear after you use ear drops as directed.   This information is not intended to replace advice given to you by your health care provider. Make sure you discuss any questions you have with your health care provider.   Document Released: 05/15/2004 Document Revised: 04/28/2014  Document Reviewed: 11/22/2014 Elsevier Interactive Patient Education 2016 [employer] Inc[employer]  

## 2016-01-21 ENCOUNTER — Ambulatory Visit (INDEPENDENT_AMBULATORY_CARE_PROVIDER_SITE_OTHER): Payer: Medicaid Other

## 2016-01-21 DIAGNOSIS — Z23 Encounter for immunization: Secondary | ICD-10-CM

## 2016-01-29 ENCOUNTER — Other Ambulatory Visit (INDEPENDENT_AMBULATORY_CARE_PROVIDER_SITE_OTHER): Payer: Medicaid Other

## 2016-01-29 DIAGNOSIS — F840 Autistic disorder: Secondary | ICD-10-CM

## 2016-01-29 LAB — BAYER DCA HB A1C WAIVED: HB A1C: 5.1 % (ref <7.0)

## 2016-01-31 LAB — COMPREHENSIVE METABOLIC PANEL
A/G RATIO: 1.8 (ref 1.2–2.2)
ALT: 56 IU/L — AB (ref 0–44)
AST: 39 IU/L (ref 0–40)
Albumin: 4.6 g/dL (ref 3.5–5.5)
Alkaline Phosphatase: 71 IU/L (ref 39–117)
BILIRUBIN TOTAL: 0.4 mg/dL (ref 0.0–1.2)
BUN/Creatinine Ratio: 10 (ref 9–20)
BUN: 10 mg/dL (ref 6–20)
CALCIUM: 9.4 mg/dL (ref 8.7–10.2)
CO2: 25 mmol/L (ref 18–29)
Chloride: 98 mmol/L (ref 96–106)
Creatinine, Ser: 0.98 mg/dL (ref 0.76–1.27)
GFR calc Af Amer: 123 mL/min/{1.73_m2} (ref >59)
GFR, EST NON AFRICAN AMERICAN: 106 mL/min/{1.73_m2} (ref >59)
Globulin, Total: 2.5 g/dL (ref 1.5–4.5)
Glucose: 88 mg/dL (ref 65–99)
POTASSIUM: 4.1 mmol/L (ref 3.5–5.2)
SODIUM: 140 mmol/L (ref 134–144)
Total Protein: 7.1 g/dL (ref 6.0–8.5)

## 2016-01-31 LAB — VALPROIC ACID LEVEL, FREE: VALPROIC ACID FREE: 10.8 ug/mL (ref 6.0–22.0)

## 2016-01-31 LAB — LIPID PANEL
CHOL/HDL RATIO: 5.4 ratio — AB (ref 0.0–5.0)
CHOLESTEROL TOTAL: 223 mg/dL — AB (ref 100–199)
HDL: 41 mg/dL (ref >39)
LDL Calculated: 129 mg/dL — ABNORMAL HIGH (ref 0–99)
TRIGLYCERIDES: 267 mg/dL — AB (ref 0–149)
VLDL Cholesterol Cal: 53 mg/dL — ABNORMAL HIGH (ref 5–40)

## 2016-01-31 LAB — VITAMIN D 25 HYDROXY (VIT D DEFICIENCY, FRACTURES): Vit D, 25-Hydroxy: 38.3 ng/mL (ref 30.0–100.0)

## 2016-01-31 LAB — TSH: TSH: 3.29 u[IU]/mL (ref 0.450–4.500)

## 2016-05-08 ENCOUNTER — Ambulatory Visit: Payer: Medicaid Other | Admitting: Family Medicine

## 2016-05-12 ENCOUNTER — Ambulatory Visit (INDEPENDENT_AMBULATORY_CARE_PROVIDER_SITE_OTHER): Payer: Medicaid Other | Admitting: Family Medicine

## 2016-05-12 ENCOUNTER — Encounter: Payer: Self-pay | Admitting: Family Medicine

## 2016-05-12 VITALS — BP 110/74 | HR 102 | Temp 97.0°F | Ht 70.0 in | Wt 214.8 lb

## 2016-05-12 DIAGNOSIS — E782 Mixed hyperlipidemia: Secondary | ICD-10-CM

## 2016-05-12 DIAGNOSIS — Z6830 Body mass index (BMI) 30.0-30.9, adult: Secondary | ICD-10-CM | POA: Diagnosis not present

## 2016-05-12 DIAGNOSIS — R740 Nonspecific elevation of levels of transaminase and lactic acid dehydrogenase [LDH]: Secondary | ICD-10-CM

## 2016-05-12 DIAGNOSIS — E6609 Other obesity due to excess calories: Secondary | ICD-10-CM

## 2016-05-12 DIAGNOSIS — R7401 Elevation of levels of liver transaminase levels: Secondary | ICD-10-CM

## 2016-05-12 NOTE — Patient Instructions (Signed)
Great to see you!  Lets plan to see him next September

## 2016-05-12 NOTE — Progress Notes (Signed)
   HPI  Patient presents today here to follow-up for hyperlipidemia, obesity, and elevated transaminase level.  Patient is feeling well and has no complaints. He has autistic disorder with generalized anxiety disorder managed by psychiatry. He had elevated cholesterol and triglycerides, mild to moderate in both cases, on last lab visit about 2-3 months ago.  He feels fine.  He is a very good appetite and does not exercise regularly.  PMH: Smoking status noted ROS: Per HPI  Objective: BP 110/74   Pulse (!) 102   Temp 97 F (36.1 C) (Oral)   Ht 5\' 10"  (1.778 m)   Wt 214 lb 12.8 oz (97.4 kg)   BMI 30.82 kg/m  Gen: NAD, alert, cooperative with exam HEENT: NCAT CV: RRR, good S1/S2, no murmur Resp: CTABL, no wheezes, non-labored Abd: SNTND, BS present, no guarding or organomegaly Ext: No edema, warm Neuro: Alert and oriented, No gross deficits  Assessment and plan:  # Hyperlipidemia Elevated cholesterol as well as triglycerides. Custody at next her size changes, this is limited due to autism No indication for medication at this time given age  # Elevated transaminase level Mild elevation, suspect fatty liver given habitus and cholesterol Repeating levels, consider ultrasound to evaluate for fatty liver if levels have elevated  # Class I obesity Therapeutic lifestyle changes, limited due to autism   No orders of the defined types were placed in this encounter.   Meds ordered this encounter  Medications  . CVS MELATONIN 3 MG TABS    Sig: Take 1 tablet by mouth.    Refill:  3  . fluvoxaMINE (LUVOX) 50 MG tablet    Sig: Take 1 tablet by mouth every evening.    Refill:  3  . divalproex (DEPAKOTE) 500 MG DR tablet    Sig: Take 2 tablets by mouth daily after supper.    Refill:  3    Murtis SinkSam Bradshaw, MD [facility] 05/12/2016, 10:44 AM

## 2016-05-13 LAB — CMP14+EGFR
A/G RATIO: 1.6 (ref 1.2–2.2)
ALBUMIN: 4.5 g/dL (ref 3.5–5.5)
ALK PHOS: 63 IU/L (ref 39–117)
ALT: 36 IU/L (ref 0–44)
AST: 29 IU/L (ref 0–40)
BILIRUBIN TOTAL: 0.4 mg/dL (ref 0.0–1.2)
BUN / CREAT RATIO: 11 (ref 9–20)
BUN: 10 mg/dL (ref 6–20)
CHLORIDE: 99 mmol/L (ref 96–106)
CO2: 23 mmol/L (ref 18–29)
Calcium: 9.4 mg/dL (ref 8.7–10.2)
Creatinine, Ser: 0.92 mg/dL (ref 0.76–1.27)
GFR calc Af Amer: 131 mL/min/{1.73_m2} (ref >59)
GFR calc non Af Amer: 114 mL/min/{1.73_m2} (ref >59)
GLOBULIN, TOTAL: 2.8 g/dL (ref 1.5–4.5)
GLUCOSE: 88 mg/dL (ref 65–99)
POTASSIUM: 4 mmol/L (ref 3.5–5.2)
SODIUM: 140 mmol/L (ref 134–144)
Total Protein: 7.3 g/dL (ref 6.0–8.5)

## 2016-05-13 NOTE — Addendum Note (Signed)
Addended by: Cleda DaubUCKER, [place] Magnan G on: 05/13/2016 03:57 PM   Modules accepted: Orders

## 2016-10-03 ENCOUNTER — Ambulatory Visit (INDEPENDENT_AMBULATORY_CARE_PROVIDER_SITE_OTHER): Payer: Medicaid Other | Admitting: Family Medicine

## 2016-10-03 ENCOUNTER — Encounter: Payer: Self-pay | Admitting: Family Medicine

## 2016-10-03 VITALS — BP 106/75 | HR 97 | Temp 97.1°F | Ht 70.0 in | Wt 220.0 lb

## 2016-10-03 DIAGNOSIS — H60333 Swimmer's ear, bilateral: Secondary | ICD-10-CM

## 2016-10-03 MED ORDER — CIPROFLOXACIN-HYDROCORTISONE 0.2-1 % OT SUSP: 3.0000 [drp] | Freq: Two times a day (BID) | OTIC | 0 refills | Status: DC

## 2016-10-03 NOTE — Progress Notes (Signed)
Chief Complaint  Patient presents with  . Ear Pain    Recently went swimming    HPI  Patient presents today for Onset yesterday evening of pain in both ears. Grandmother with him says that he usually has to have his ears cleaned out about once a year. They are painful. However he denies hearing loss. No recent URI symptoms reported. No fever chills or sweats.  PMH: Smoking status noted ROS: Per HPI  Objective: BP 106/75   Pulse 97   Temp 97.1 F (36.2 C) (Oral)   Ht 5\' 10"  (1.778 m)   Wt 220 lb (99.8 kg)   BMI 31.57 kg/m  Gen: NAD, alert, cooperative with exam HEENT: NCAT, EOMI, PERRL. There is moderate amount of cerumen bilaterally. This was removed by lavage revealing some mild inflammation of the canals. TMs are within normal limits. CV: RRR, good S1/S2, no murmur Resp: CTABL, no wheezes, non-labored  Assessment and plan:  1. Acute swimmer's ear of both sides     Meds ordered this encounter  Medications  . ciprofloxacin-hydrocortisone (CIPRO HC OTIC) OTIC suspension    Sig: Place 3 drops into both ears 2 (two) times daily. For one week    Dispense:  10 mL    Refill:  0    No orders of the defined types were placed in this encounter.   Follow up as needed.  Mechele ClaudeWarren Marcelo Ickes, MD

## 2016-10-07 ENCOUNTER — Telehealth: Payer: Self-pay

## 2016-10-07 MED ORDER — NEOMYCIN-POLYMYXIN-HC 3.5-10000-1 OT SOLN: 4.0000 [drp] | Freq: Four times a day (QID) | OTIC | 0 refills | Status: DC

## 2016-10-07 NOTE — Telephone Encounter (Signed)
Aware of medication change due to insurance coverage

## 2016-10-07 NOTE — Telephone Encounter (Signed)
Change to cortisporin  William SinkSam Othello Sgroi, MD [facility] 10/07/2016, 11:59 AM

## 2016-12-23 ENCOUNTER — Ambulatory Visit (INDEPENDENT_AMBULATORY_CARE_PROVIDER_SITE_OTHER): Payer: Medicaid Other | Admitting: Family Medicine

## 2016-12-23 ENCOUNTER — Encounter: Payer: Self-pay | Admitting: Family Medicine

## 2016-12-23 VITALS — BP 121/75 | HR 90 | Temp 97.8°F | Ht 70.0 in | Wt 225.0 lb

## 2016-12-23 DIAGNOSIS — F411 Generalized anxiety disorder: Secondary | ICD-10-CM

## 2016-12-23 DIAGNOSIS — E669 Obesity, unspecified: Secondary | ICD-10-CM

## 2016-12-23 DIAGNOSIS — F840 Autistic disorder: Secondary | ICD-10-CM

## 2016-12-23 DIAGNOSIS — L600 Ingrowing nail: Secondary | ICD-10-CM

## 2016-12-23 LAB — BAYER DCA HB A1C WAIVED: HB A1C (BAYER DCA - WAIVED): 5.2 % (ref <7.0)

## 2016-12-23 MED ORDER — MUPIROCIN 2 % EX OINT: 1.0000 "application " | TOPICAL_OINTMENT | Freq: Two times a day (BID) | CUTANEOUS | 0 refills | Status: DC

## 2016-12-23 NOTE — Progress Notes (Signed)
HPI  Patient presents today for follow-up of chronic medical conditions and ingrown toenail.  Ingrown toenail Has improved, patient had pain about 2 weeks ago which has resolved.  Anxiety, autism Patient works closely with psychiatry He is using Depakote and Luvox as prescribed. Good medication compliance  Mother also states that he's been eating more, exercising less. He likes to hike, otherwise he does not like exercise. He does not drink sodas any longer, he does eat out frequently with his grandmother while his mother works.    PMH: Smoking status noted ROS: Per HPI  Objective: BP 121/75   Pulse 90   Temp 97.8 F (36.6 C) (Oral)   Ht 5' 10" (1.778 m)   Wt 225 lb (102.1 kg)   BMI 32.28 kg/m  Gen: NAD, alert, cooperative with exam HEENT: NCAT, EOMI, PERRL CV: RRR, good S1/S2, no murmur Resp: CTABL, no wheezes, non-labored Abd: SNTND, BS present, no guarding or organomegaly Ext: No edema, warm Neuro: Alert and oriented, No gross deficits Skin:  Mild erythema with no tenderness or induration of the left great toe at the medial nail fold  Assessment and plan:  # Ingrown toenail Resolved Discussed supportive care for ingrown toenails and instructions given from up-to-date Mupirocin ointment if needed  # Obesity Difficult situation given autism Discussed supportive care and interventions that can be performed.   # Generalized anxiety disorder, autistic disorder Labs More labs than typical 26-year-old given that he is on Depakote and his obesity. Labs printed to send a psychiatrist    Orders Placed This Encounter  Procedures  . Lipid panel  . CMP14+EGFR  . CBC with Differential/Platelet  . Valproic acid level  . Bayer DCA Hb A1c Waived    Meds ordered this encounter  Medications  . mupirocin ointment (BACTROBAN) 2 %    Sig: Apply 1 application topically 2 (two) times daily.    Dispense:  22 g    Refill:  0    Sam Bradshaw, MD [facility] 12/23/2016, 10:34 AM     

## 2016-12-23 NOTE — Patient Instructions (Signed)
Great to see you!  We will be glad to give you a result of his labs so that you will have them for psychiatry.

## 2016-12-24 LAB — CBC WITH DIFFERENTIAL/PLATELET
BASOS: 0 %
Basophils Absolute: 0 10*3/uL (ref 0.0–0.2)
EOS (ABSOLUTE): 0 10*3/uL (ref 0.0–0.4)
EOS: 1 %
HEMATOCRIT: 42.5 % (ref 37.5–51.0)
HEMOGLOBIN: 14.6 g/dL (ref 13.0–17.7)
IMMATURE GRANS (ABS): 0 10*3/uL (ref 0.0–0.1)
IMMATURE GRANULOCYTES: 0 %
LYMPHS: 42 %
Lymphocytes Absolute: 2.3 10*3/uL (ref 0.7–3.1)
MCH: 31.6 pg (ref 26.6–33.0)
MCHC: 34.4 g/dL (ref 31.5–35.7)
MCV: 92 fL (ref 79–97)
MONOCYTES: 9 %
Monocytes Absolute: 0.5 10*3/uL (ref 0.1–0.9)
Neutrophils Absolute: 2.7 10*3/uL (ref 1.4–7.0)
Neutrophils: 48 %
Platelets: 241 10*3/uL (ref 150–379)
RBC: 4.62 x10E6/uL (ref 4.14–5.80)
RDW: 13.5 % (ref 12.3–15.4)
WBC: 5.5 10*3/uL (ref 3.4–10.8)

## 2016-12-24 LAB — LIPID PANEL
CHOL/HDL RATIO: 5.9 ratio — AB (ref 0.0–5.0)
Cholesterol, Total: 219 mg/dL — ABNORMAL HIGH (ref 100–199)
HDL: 37 mg/dL — ABNORMAL LOW (ref >39)
LDL CALC: 106 mg/dL — AB (ref 0–99)
TRIGLYCERIDES: 378 mg/dL — AB (ref 0–149)
VLDL Cholesterol Cal: 76 mg/dL — ABNORMAL HIGH (ref 5–40)

## 2016-12-24 LAB — CMP14+EGFR
A/G RATIO: 1.9 (ref 1.2–2.2)
ALT: 32 IU/L (ref 0–44)
AST: 23 IU/L (ref 0–40)
Albumin: 4.5 g/dL (ref 3.5–5.5)
Alkaline Phosphatase: 65 IU/L (ref 39–117)
BUN/Creatinine Ratio: 11 (ref 9–20)
BUN: 10 mg/dL (ref 6–20)
Bilirubin Total: 0.4 mg/dL (ref 0.0–1.2)
CALCIUM: 9.4 mg/dL (ref 8.7–10.2)
CO2: 25 mmol/L (ref 20–29)
Chloride: 98 mmol/L (ref 96–106)
Creatinine, Ser: 0.91 mg/dL (ref 0.76–1.27)
GFR calc Af Amer: 133 mL/min/{1.73_m2} (ref >59)
GFR, EST NON AFRICAN AMERICAN: 115 mL/min/{1.73_m2} (ref >59)
GLUCOSE: 81 mg/dL (ref 65–99)
Globulin, Total: 2.4 g/dL (ref 1.5–4.5)
POTASSIUM: 4.1 mmol/L (ref 3.5–5.2)
Sodium: 138 mmol/L (ref 134–144)
Total Protein: 6.9 g/dL (ref 6.0–8.5)

## 2016-12-24 LAB — VALPROIC ACID LEVEL: Valproic Acid Lvl: 79 ug/mL (ref 50–100)

## 2017-01-19 ENCOUNTER — Ambulatory Visit (INDEPENDENT_AMBULATORY_CARE_PROVIDER_SITE_OTHER): Payer: Medicaid Other

## 2017-01-19 DIAGNOSIS — Z23 Encounter for immunization: Secondary | ICD-10-CM

## 2017-04-16 ENCOUNTER — Other Ambulatory Visit: Payer: Medicaid Other

## 2017-04-16 DIAGNOSIS — Z79899 Other long term (current) drug therapy: Secondary | ICD-10-CM

## 2017-04-16 DIAGNOSIS — F840 Autistic disorder: Secondary | ICD-10-CM

## 2017-04-16 LAB — BAYER DCA HB A1C WAIVED: HB A1C: 5.2 % (ref <7.0)

## 2017-04-17 LAB — CMP14+EGFR
ALBUMIN: 4.5 g/dL (ref 3.5–5.5)
ALK PHOS: 73 IU/L (ref 39–117)
ALT: 37 IU/L (ref 0–44)
AST: 27 IU/L (ref 0–40)
Albumin/Globulin Ratio: 1.6 (ref 1.2–2.2)
BILIRUBIN TOTAL: 0.3 mg/dL (ref 0.0–1.2)
BUN / CREAT RATIO: 10 (ref 9–20)
BUN: 7 mg/dL (ref 6–20)
CO2: 24 mmol/L (ref 20–29)
Calcium: 9.3 mg/dL (ref 8.7–10.2)
Chloride: 100 mmol/L (ref 96–106)
Creatinine, Ser: 0.73 mg/dL — ABNORMAL LOW (ref 0.76–1.27)
GFR calc Af Amer: 147 mL/min/{1.73_m2} (ref >59)
GFR calc non Af Amer: 127 mL/min/{1.73_m2} (ref >59)
Globulin, Total: 2.8 g/dL (ref 1.5–4.5)
Glucose: 83 mg/dL (ref 65–99)
Potassium: 3.9 mmol/L (ref 3.5–5.2)
Sodium: 143 mmol/L (ref 134–144)
Total Protein: 7.3 g/dL (ref 6.0–8.5)

## 2017-04-17 LAB — TSH: TSH: 2.51 u[IU]/mL (ref 0.450–4.500)

## 2017-04-17 LAB — LIPID PANEL
CHOL/HDL RATIO: 5.9 ratio — AB (ref 0.0–5.0)
Cholesterol, Total: 213 mg/dL — ABNORMAL HIGH (ref 100–199)
HDL: 36 mg/dL — AB (ref >39)
Triglycerides: 420 mg/dL — ABNORMAL HIGH (ref 0–149)

## 2017-04-17 LAB — VALPROIC ACID LEVEL: VALPROIC ACID LVL: 85 ug/mL (ref 50–100)

## 2017-04-22 ENCOUNTER — Telehealth: Payer: Self-pay | Admitting: Family Medicine

## 2017-04-22 NOTE — Telephone Encounter (Signed)
Result in result note.   Murtis SinkSam Emmalie Haigh, MD [facility] 04/22/2017, 1:00 PM

## 2017-04-22 NOTE — Telephone Encounter (Signed)
Wanting labs for 12/27- has not been revived yet. Please review and advise

## 2017-08-10 ENCOUNTER — Telehealth: Payer: Self-pay | Admitting: Family Medicine

## 2017-08-10 NOTE — Telephone Encounter (Signed)
appt made

## 2017-08-12 ENCOUNTER — Ambulatory Visit: Payer: Medicaid Other | Admitting: Family

## 2017-08-13 ENCOUNTER — Ambulatory Visit (INDEPENDENT_AMBULATORY_CARE_PROVIDER_SITE_OTHER): Payer: Medicaid Other | Admitting: Family

## 2017-08-13 ENCOUNTER — Encounter: Payer: Self-pay | Admitting: Family

## 2017-08-13 VITALS — BP 126/71 | HR 108 | Temp 97.3°F | Ht 70.0 in | Wt 216.8 lb

## 2017-08-13 DIAGNOSIS — E559 Vitamin D deficiency, unspecified: Secondary | ICD-10-CM

## 2017-08-13 DIAGNOSIS — Z23 Encounter for immunization: Secondary | ICD-10-CM | POA: Diagnosis not present

## 2017-08-13 DIAGNOSIS — F840 Autistic disorder: Secondary | ICD-10-CM

## 2017-08-13 DIAGNOSIS — F5101 Primary insomnia: Secondary | ICD-10-CM

## 2017-08-13 DIAGNOSIS — Z0000 Encounter for general adult medical examination without abnormal findings: Secondary | ICD-10-CM | POA: Diagnosis not present

## 2017-08-13 DIAGNOSIS — F411 Generalized anxiety disorder: Secondary | ICD-10-CM

## 2017-08-13 NOTE — Addendum Note (Signed)
Addended by: Almeta MonasSTONE, JANIE M on: 08/13/2017 04:37 PM   Modules accepted: Orders

## 2017-08-13 NOTE — Patient Instructions (Signed)

## 2017-08-13 NOTE — Progress Notes (Signed)
  Subjective:    Patient ID: William Galvan, male    DOB: Jun 13, 1990, 27 y.o.   MRN: [id number]  PT presents to the office today for CPE and to have forms completed for [employer]. PT is followed by [facility] every 3 months for GAD.  Anxiety  Presents for follow-up visit. Symptoms include excessive worry, insomnia, irritability, nervous/anxious behavior and panic. Patient reports no decreased concentration. Symptoms occur occasionally. The severity of symptoms is moderate. The quality of sleep is good.    Insomnia  Primary symptoms: difficulty falling asleep.  The current episode started more than one year. The onset quality is gradual.      Review of Systems  Constitutional: Positive for irritability.  Psychiatric/Behavioral: Negative for decreased concentration. The patient is nervous/anxious and has insomnia.   All other systems reviewed and are negative.      Objective:   Physical Exam  Constitutional: He is oriented to person, place, and time. He appears well-developed and well-nourished. No distress.  HENT:  Head: Normocephalic.  Right Ear: External ear normal.  Left Ear: External ear normal.  Nose: Nose normal.  Mouth/Throat: Oropharynx is clear and moist.  Eyes: Pupils are equal, round, and reactive to light. Right eye exhibits no discharge. Left eye exhibits no discharge.  Neck: Normal range of motion. Neck supple. No thyromegaly present.  Cardiovascular: Normal rate, regular rhythm, normal heart sounds and intact distal pulses.  No murmur heard. Pulmonary/Chest: Effort normal and breath sounds normal. No respiratory distress. He has no wheezes.  Abdominal: Soft. Bowel sounds are normal. He exhibits no distension. There is no tenderness.  Musculoskeletal: Normal range of motion. He exhibits no edema or tenderness.  Neurological: He is alert and oriented to person, place, and time. He has normal reflexes. No cranial nerve deficit.  Skin: Skin is warm and dry. No rash  noted. No erythema.  Psychiatric: Thought content normal. His mood appears anxious. He expresses inappropriate judgment.  Vitals reviewed.     BP 112/76 (BP Location: Right Arm, Patient Position: Sitting, Cuff Size: Normal)   Pulse 97   Temp (!) 97.3 F (36.3 C) (Oral)   Ht '5\' 10"'$  (1.778 m)   Wt 216 lb 12.8 oz (98.3 kg)   BMI 31.11 kg/m      Assessment & Plan:  1. Autistic disorder, active  2. Vitamin D deficiency  3. GAD (generalized anxiety disorder)  4. Annual physical exam - CMP14+EGFR - CBC with Differential/Platelet - Lipid panel - TSH  5. Primary insomnia   Continue all meds Labs pending Health Maintenance reviewed-TDAP given today Diet and exercise encouraged RTO 1 year and keep all appts with [place], FNP

## 2017-08-14 LAB — CBC WITH DIFFERENTIAL/PLATELET
Basophils Absolute: 0 10*3/uL (ref 0.0–0.2)
Basos: 0 %
EOS (ABSOLUTE): 0 10*3/uL (ref 0.0–0.4)
EOS: 1 %
HEMATOCRIT: 42.5 % (ref 37.5–51.0)
HEMOGLOBIN: 14.7 g/dL (ref 13.0–17.7)
IMMATURE GRANS (ABS): 0 10*3/uL (ref 0.0–0.1)
IMMATURE GRANULOCYTES: 0 %
LYMPHS: 31 %
Lymphocytes Absolute: 2.6 10*3/uL (ref 0.7–3.1)
MCH: 31.4 pg (ref 26.6–33.0)
MCHC: 34.6 g/dL (ref 31.5–35.7)
MCV: 91 fL (ref 79–97)
MONOCYTES: 8 %
Monocytes Absolute: 0.7 10*3/uL (ref 0.1–0.9)
NEUTROS PCT: 60 %
Neutrophils Absolute: 5 10*3/uL (ref 1.4–7.0)
Platelets: 268 10*3/uL (ref 150–379)
RBC: 4.68 x10E6/uL (ref 4.14–5.80)
RDW: 13.7 % (ref 12.3–15.4)
WBC: 8.3 10*3/uL (ref 3.4–10.8)

## 2017-08-14 LAB — CMP14+EGFR
A/G RATIO: 1.8 (ref 1.2–2.2)
ALBUMIN: 4.6 g/dL (ref 3.5–5.5)
ALT: 53 IU/L — ABNORMAL HIGH (ref 0–44)
AST: 39 IU/L (ref 0–40)
Alkaline Phosphatase: 69 IU/L (ref 39–117)
BILIRUBIN TOTAL: 0.4 mg/dL (ref 0.0–1.2)
BUN/Creatinine Ratio: 8 — ABNORMAL LOW (ref 9–20)
BUN: 8 mg/dL (ref 6–20)
CO2: 23 mmol/L (ref 20–29)
CREATININE: 0.97 mg/dL (ref 0.76–1.27)
Calcium: 9.3 mg/dL (ref 8.7–10.2)
Chloride: 100 mmol/L (ref 96–106)
GFR calc Af Amer: 122 mL/min/{1.73_m2} (ref >59)
GFR, EST NON AFRICAN AMERICAN: 106 mL/min/{1.73_m2} (ref >59)
GLOBULIN, TOTAL: 2.6 g/dL (ref 1.5–4.5)
Glucose: 94 mg/dL (ref 65–99)
Potassium: 4 mmol/L (ref 3.5–5.2)
Sodium: 141 mmol/L (ref 134–144)
TOTAL PROTEIN: 7.2 g/dL (ref 6.0–8.5)

## 2017-08-14 LAB — TSH: TSH: 1.87 u[IU]/mL (ref 0.450–4.500)

## 2017-08-14 LAB — LIPID PANEL
CHOL/HDL RATIO: 5.6 ratio — AB (ref 0.0–5.0)
Cholesterol, Total: 207 mg/dL — ABNORMAL HIGH (ref 100–199)
HDL: 37 mg/dL — AB (ref >39)
LDL Calculated: 97 mg/dL (ref 0–99)
TRIGLYCERIDES: 367 mg/dL — AB (ref 0–149)
VLDL CHOLESTEROL CAL: 73 mg/dL — AB (ref 5–40)

## 2017-08-17 ENCOUNTER — Other Ambulatory Visit: Payer: Self-pay | Admitting: Family

## 2017-08-17 ENCOUNTER — Encounter: Payer: Self-pay | Admitting: Family Medicine

## 2017-08-17 DIAGNOSIS — E781 Pure hyperglyceridemia: Secondary | ICD-10-CM | POA: Insufficient documentation

## 2017-11-12 ENCOUNTER — Ambulatory Visit: Payer: Medicaid Other | Admitting: Family Medicine

## 2017-11-12 ENCOUNTER — Encounter: Payer: Self-pay | Admitting: Family Medicine

## 2017-11-12 VITALS — BP 125/79 | HR 99 | Temp 98.5°F | Ht 70.0 in | Wt 224.0 lb

## 2017-11-12 DIAGNOSIS — H6123 Impacted cerumen, bilateral: Secondary | ICD-10-CM

## 2017-11-12 NOTE — Progress Notes (Signed)
Subjective: CC: ear stopped up PCP: Elenora GammaBradshaw, Samuel L, MD XBJ:[id number]:William Galvan is a 27 y.o. male presenting to clinic today for:  1. Ear stopped up Patient reports that he was in water earlier this week.  He notes that the right ear feels stopped up and he cannot hear.  Denies any significant ear pain.  He is accompanied today by his grandmother who notes that this happens every year and he often will need an irrigation of the ear.  He has been using an eardrop from the drugstore but cannot recall the name.  No fevers, chills, ear drainage.   ROS: Per HPI  No Known Allergies Past Medical History:  Diagnosis Date  . Anxiety   . Autism     Current Outpatient Medications:  .  CVS MELATONIN 3 MG TABS, Take 1 tablet by mouth., Disp: , Rfl: 3 .  divalproex (DEPAKOTE) 500 MG DR tablet, Take 2 tablets by mouth daily after supper., Disp: , Rfl: 3 .  fluvoxaMINE (LUVOX) 50 MG tablet, Take 1 tablet by mouth every evening., Disp: , Rfl: 3 .  mupirocin ointment (BACTROBAN) 2 %, Apply 1 application topically 2 (two) times daily., Disp: 22 g, Rfl: 0 Social History   Socioeconomic History  . Marital status: Single    Spouse name: Not on file  . Number of children: Not on file  . Years of education: Not on file  . Highest education level: Not on file  Occupational History  . Not on file  Social Needs  . Financial resource strain: Not on file  . Food insecurity:    Worry: Not on file    Inability: Not on file  . Transportation needs:    Medical: Not on file    Non-medical: Not on file  Tobacco Use  . Smoking status: Never Smoker  . Smokeless tobacco: Never Used  Substance and Sexual Activity  . Alcohol use: No  . Drug use: No  . Sexual activity: Not on file  Lifestyle  . Physical activity:    Days per week: Not on file    Minutes per session: Not on file  . Stress: Not on file  Relationships  . Social connections:    Talks on phone: Not on file    Gets together: Not on file   Attends religious service: Not on file    Active member of club or organization: Not on file    Attends meetings of clubs or organizations: Not on file    Relationship status: Not on file  . Intimate partner violence:    Fear of current or ex partner: Not on file    Emotionally abused: Not on file    Physically abused: Not on file    Forced sexual activity: Not on file  Other Topics Concern  . Not on file  Social History Narrative  . Not on file   Family History  Problem Relation Age of Onset  . Mental illness Sister     Objective: Office vital signs reviewed. BP 125/79   Pulse 99   Temp 98.5 F (36.9 C) (Oral)   Ht 5\' 10"  (1.778 m)   Wt 224 lb (101.6 kg)   BMI 32.14 kg/m   Physical Examination:  General: Awake, alert, well nourished, No acute distress HEENT: Normal    Ears: Tympanic membranes occluded by cerumen bilaterally.  No tenderness to palpation to the tragus or mastoid process.  No appreciable erythema within the external ear.  Eyes: sclera white   Assessment/ Plan: 27 y.o. male   1. Bilateral impacted cerumen Right ear was able to be successfully irrigated.  There was no evidence of middle or external ear infection on the right.  Left ear with persistent cerumen.  I recommended that they use Debrox in the left ear for the next 5 to 7 days in efforts to soften the wax.  If needed, may return to office for irrigation.  If he develops any signs or symptoms of infection, instructed them to call the office at that point can consider prescribing eardrop.  Follow-up as needed.   Raliegh Ip, DO [facility] [phone]

## 2017-11-12 NOTE — Patient Instructions (Signed)
We were able to get the earwax out of the right side and there is no evidence of infection and there.  The left side still has some residual wax.  I recommend that you use Debrox for the next 5 to 7 days to see if we can soften this wax so that it may come out.  If you develop any ear pain or drainage, please contact our office and I will send in an antibiotic eardrop.  Earwax Buildup, Adult The ears produce a substance called earwax that helps keep bacteria out of the ear and protects the skin in the ear canal. Occasionally, earwax can build up in the ear and cause discomfort or hearing loss. What increases the risk? This condition is more likely to develop in people who:  Are male.  Are elderly.  Naturally produce more earwax.  Clean their ears often with cotton swabs.  Use earplugs often.  Use in-ear headphones often.  Wear hearing aids.  Have narrow ear canals.  Have earwax that is overly thick or sticky.  Have eczema.  Are dehydrated.  Have excess hair in the ear canal.  What are the signs or symptoms? Symptoms of this condition include:  Reduced or muffled hearing.  A feeling of fullness in the ear or feeling that the ear is plugged.  Fluid coming from the ear.  Ear pain.  Ear itch.  Ringing in the ear.  Coughing.  An obvious piece of earwax that can be seen inside the ear canal.  How is this diagnosed? This condition may be diagnosed based on:  Your symptoms.  Your medical history.  An ear exam. During the exam, your health care provider will look into your ear with an instrument called an otoscope.  You may have tests, including a hearing test. How is this treated? This condition may be treated by:  Using ear drops to soften the earwax.  Having the earwax removed by a health care provider. The health care provider may: ? Flush the ear with water. ? Use an instrument that has a loop on the end (curette). ? Use a suction device.  Surgery to  remove the wax buildup. This may be done in severe cases.  Follow these instructions at home:  Take over-the-counter and prescription medicines only as told by your health care provider.  Do not put any objects, including cotton swabs, into your ear. You can clean the opening of your ear canal with a washcloth or facial tissue.  Follow instructions from your health care provider about cleaning your ears. Do not over-clean your ears.  Drink enough fluid to keep your urine clear or pale yellow. This will help to thin the earwax.  Keep all follow-up visits as told by your health care provider. If earwax builds up in your ears often or if you use hearing aids, consider seeing your health care provider for routine, preventive ear cleanings. Ask your health care provider how often you should schedule your cleanings.  If you have hearing aids, clean them according to instructions from the manufacturer and your health care provider. Contact a health care provider if:  You have ear pain.  You develop a fever.  You have blood, pus, or other fluid coming from your ear.  You have hearing loss.  You have ringing in your ears that does not go away.  Your symptoms do not improve with treatment.  You feel like the room is spinning (vertigo). Summary  Earwax can build up in  the ear and cause discomfort or hearing loss.  The most common symptoms of this condition include reduced or muffled hearing and a feeling of fullness in the ear or feeling that the ear is plugged.  This condition may be diagnosed based on your symptoms, your medical history, and an ear exam.  This condition may be treated by using ear drops to soften the earwax or by having the earwax removed by a health care provider.  Do not put any objects, including cotton swabs, into your ear. You can clean the opening of your ear canal with a washcloth or facial tissue. This information is not intended to replace advice given to you  by your health care provider. Make sure you discuss any questions you have with your health care provider. Document Released: 05/15/2004 Document Revised: 06/18/2016 Document Reviewed: 06/18/2016 Elsevier Interactive Patient Education  [employer].

## 2017-11-17 ENCOUNTER — Ambulatory Visit: Payer: Medicaid Other | Admitting: Family Medicine

## 2017-11-17 ENCOUNTER — Encounter: Payer: Self-pay | Admitting: Family Medicine

## 2017-11-17 VITALS — BP 117/76 | HR 88 | Temp 97.4°F | Ht 70.0 in | Wt 223.0 lb

## 2017-11-17 DIAGNOSIS — H6122 Impacted cerumen, left ear: Secondary | ICD-10-CM

## 2017-11-17 NOTE — Progress Notes (Signed)
Subjective: CC: Decreased hearing in left ear PCP: Elenora Gamma, MD William Galvan is a 27 y.o. male, who is accompanied by his grandmother and is presenting to clinic today for:  1. Decreased hearing Patient was seen 5 days ago for bilateral impacted cerumen.  We are able to successfully remove the wax on the right side but left side was too impacted for removal.  He was discharged with instructions to use Debrox in the left ear and return for irrigation if needed.  He reports decreased hearing in the left side.  Symptoms seemed worse after application of Debrox.  He returns today to see if this can be irrigated.  No fevers, chills, cough, congestion or dizziness.  No drainage from left ear.   ROS: Per HPI  No Known Allergies Past Medical History:  Diagnosis Date  . Anxiety   . Autism     Current Outpatient Medications:  .  [facility] MELATONIN 3 MG TABS, Take 1 tablet by mouth., Disp: , Rfl: 3 .  divalproex (DEPAKOTE) 500 MG DR tablet, Take 2 tablets by mouth daily after supper., Disp: , Rfl: 3 .  fluvoxaMINE (LUVOX) 50 MG tablet, Take 1 tablet by mouth every evening., Disp: , Rfl: 3 .  LORazepam (ATIVAN) 0.5 MG tablet, Take one tablet as needed for panic attack. No more than 1 a day or 5 in one month. as needed, Disp: , Rfl:  .  mupirocin ointment (BACTROBAN) 2 %, Apply 1 application topically 2 (two) times daily., Disp: 22 g, Rfl: 0 Social History   Socioeconomic History  . Marital status: Single    Spouse name: Not on file  . Number of children: Not on file  . Years of education: Not on file  . Highest education level: Not on file  Occupational History  . Not on file  Social Needs  . Financial resource strain: Not on file  . Food insecurity:    Worry: Not on file    Inability: Not on file  . Transportation needs:    Medical: Not on file    Non-medical: Not on file  Tobacco Use  . Smoking status: Never Smoker  . Smokeless tobacco: Never Used  Substance and Sexual  Activity  . Alcohol use: No  . Drug use: No  . Sexual activity: Not on file  Lifestyle  . Physical activity:    Days per week: Not on file    Minutes per session: Not on file  . Stress: Not on file  Relationships  . Social connections:    Talks on phone: Not on file    Gets together: Not on file    Attends religious service: Not on file    Active member of club or organization: Not on file    Attends meetings of clubs or organizations: Not on file    Relationship status: Not on file  . Intimate partner violence:    Fear of current or ex partner: Not on file    Emotionally abused: Not on file    Physically abused: Not on file    Forced sexual activity: Not on file  Other Topics Concern  . Not on file  Social History Narrative  . Not on file   Family History  Problem Relation Age of Onset  . Mental illness Sister     Objective: Office vital signs reviewed. BP 117/76   Pulse 88   Temp (!) 97.4 F (36.3 C) (Oral)   Ht 5\' 10"  (1.778  m)   Wt 223 lb (101.2 kg)   BMI 32.00 kg/m   Physical Examination:  General: Awake, alert, well nourished, nontoxic, No acute distress HEENT: Normal    Neck: No masses palpated. No lymphadenopathy    Ears: R Tympanic membranes intact, normal light reflex, no erythema, no bulging; Left TM occluded by wax   Assessment/ Plan: 27 y.o. male   1. Impacted cerumen of left ear Patient is afebrile nontoxic-appearing.  Irrigation was performed to remove impacted cerumen of left ear.  This was successful today.  Exam of left ear without any evidence of infection.  Tympanic membrane was intact with normal light reflex.  No erythema or inflammation appreciated in the left external auditory canal.  William IpAshly M Zylpha Poynor, DO [facility] [phone]

## 2018-01-18 ENCOUNTER — Ambulatory Visit: Payer: Medicaid Other | Admitting: Family

## 2018-01-18 ENCOUNTER — Encounter: Payer: Self-pay | Admitting: Family

## 2018-01-18 VITALS — BP 109/73 | HR 93 | Temp 97.1°F | Ht 70.0 in | Wt 226.6 lb

## 2018-01-18 DIAGNOSIS — L720 Epidermal cyst: Secondary | ICD-10-CM

## 2018-01-18 MED ORDER — DOXYCYCLINE HYCLATE 100 MG PO TABS: 100.0000 mg | ORAL_TABLET | Freq: Two times a day (BID) | ORAL | 0 refills | Status: DC

## 2018-01-18 NOTE — Progress Notes (Signed)
   Subjective:    Patient ID: William Galvan, male    DOB: [birth year], 27 y.o.   MRN: [id number]  Chief Complaint  Patient presents with  . knot on back of neck    some pain    HPI PT presents to the office today with a "bump on the back of my neck" that he noticed last Wednesday. States the size is the unchanged. He reports he has aching pain of 2-3. He has not tried anything.    Review of Systems  Skin:       Cyst   All other systems reviewed and are negative.      Objective:   Physical Exam  Constitutional: He is oriented to person, place, and time. He appears well-developed and well-nourished. No distress.  HENT:  Head: Normocephalic.  Eyes: Pupils are equal, round, and reactive to light. Right eye exhibits no discharge. Left eye exhibits no discharge.  Neck: Normal range of motion. Neck supple. No thyromegaly present.  Cardiovascular: Normal rate, regular rhythm, normal heart sounds and intact distal pulses.  No murmur heard. Pulmonary/Chest: Effort normal and breath sounds normal. No respiratory distress. He has no wheezes.  Abdominal: Soft. Bowel sounds are normal. He exhibits no distension. There is no tenderness.  Musculoskeletal: Normal range of motion. He exhibits no edema or tenderness.  Neurological: He is alert and oriented to person, place, and time. He has normal reflexes. No cranial nerve deficit.  Skin: Skin is warm and dry. No rash noted. No erythema.     Hard cyst, erythemas and purple, tender  Psychiatric: He has a normal mood and affect. His behavior is normal. Judgment and thought content normal.  Vitals reviewed.    BP 109/73   Pulse 93   Temp (!) 97.1 F (36.2 C) (Oral)   Ht 5\' 10"  (1.778 m)   Wt 226 lb 9.6 oz (102.8 kg)   BMI 32.51 kg/m      Assessment & Plan:  Kinston Magnan comes in today with chief complaint of knot on back of neck (some pain)   Diagnosis and orders addressed:  1. Epidermal cyst of neck Do not pick or squeeze  Warm  compresses RTO in 10 days - doxycycline (VIBRA-TABS) 100 MG tablet; Take 1 tablet (100 mg total) by mouth 2 (two) times daily.  Dispense: 20 tablet; Refill: 0   Jannifer Rodney, FNP

## 2018-01-18 NOTE — Patient Instructions (Signed)
Epidermal Cyst An epidermal cyst is sometimes called an epidermal inclusion cyst or an infundibular cyst. It is a sac made of skin tissue. The sac contains a substance called keratin. Keratin is a protein that is normally secreted through the hair follicles. When keratin becomes trapped in the top layer of skin (epidermis), it can form an epidermal cyst. Epidermal cysts are usually found on the face, neck, trunk, and genitals. These cysts are usually harmless (benign), and they may not cause symptoms unless they become infected. It is important not to pop epidermal cysts yourself. What are the causes? This condition may be caused by:  A blocked hair follicle.  A hair that curls and re-enters the skin instead of growing straight out of the skin (ingrown hair).  A blocked pore.  Irritated skin.  An injury to the skin.  Certain conditions that are passed along from parent to child (inherited).  Human papillomavirus (HPV).  What increases the risk? The following factors may make you more likely to develop an epidermal cyst:  Having acne.  Being overweight.  Wearing tight clothing.  What are the signs or symptoms? The only symptom of this condition may be a small, painless lump underneath the skin. When an epidermal cyst becomes infected, symptoms may include:  Redness.  Inflammation.  Tenderness.  Warmth.  Fever.  Keratin draining from the cyst. Keratin may look like a grayish-white, bad-smelling substance.  Pus draining from the cyst.  How is this diagnosed? This condition is diagnosed with a physical exam. In some cases, you may have a sample of tissue (biopsy) taken from your cyst to be examined under a microscope or tested for bacteria. You may be referred to a health care provider who specializes in skin care (dermatologist). How is this treated? In many cases, epidermal cysts go away on their own without treatment. If a cyst becomes infected, treatment may  include:  Opening and draining the cyst. After draining, minor surgery to remove the rest of the cyst may be done.  Antibiotic medicine to help prevent infection.  Injections of medicines (steroids) that help to reduce inflammation.  Surgery to remove the cyst. Surgery may be done if: ? The cyst becomes large. ? The cyst bothers you. ? There is a chance that the cyst could turn into cancer.  Follow these instructions at home:  Take over-the-counter and prescription medicines only as told by your health care provider.  If you were prescribed an antibiotic, use it as told by your health care provider. Do not stop using the antibiotic even if you start to feel better.  Keep the area around your cyst clean and dry.  Wear loose, dry clothing.  Do not try to pop your cyst.  Avoid touching your cyst.  Check your cyst every day for signs of infection.  Keep all follow-up visits as told by your health care provider. This is important. How is this prevented?  Wear clean, dry, clothing.  Avoid wearing tight clothing.  Keep your skin clean and dry. Shower or take baths every day.  Wash your body with a benzoyl peroxide wash when you shower or bathe. Contact a health care provider if:  Your cyst develops symptoms of infection.  Your condition is not improving or is getting worse.  You develop a cyst that looks different from other cysts you have had.  You have a fever. Get help right away if:  Redness spreads from the cyst into the surrounding area. This information is   not intended to replace advice given to you by your health care provider. Make sure you discuss any questions you have with your health care provider. Document Released: 03/08/2004 Document Revised: 12/05/2015 Document Reviewed: 02/07/2015 Elsevier Interactive Patient Education  2018 [employer] Inc[employer]  

## 2018-01-28 ENCOUNTER — Encounter: Payer: Self-pay | Admitting: Family

## 2018-01-28 ENCOUNTER — Ambulatory Visit: Payer: Medicaid Other | Admitting: Family

## 2018-01-28 VITALS — BP 115/76 | HR 96 | Temp 97.4°F | Ht 70.0 in | Wt 230.2 lb

## 2018-01-28 DIAGNOSIS — Z23 Encounter for immunization: Secondary | ICD-10-CM

## 2018-01-28 DIAGNOSIS — L720 Epidermal cyst: Secondary | ICD-10-CM | POA: Diagnosis not present

## 2018-01-28 NOTE — Patient Instructions (Signed)

## 2018-01-28 NOTE — Progress Notes (Signed)
   Subjective:    Patient ID: William Galvan, male    DOB: 10/21/1990, 27 y.o.   MRN: [id number]  Chief Complaint  Patient presents with  . recheck cyst on neck    HPI PT presents to the office today to recheck cyst on back of neck. He was seen on 01/18/18 and started on Doxycycline. He reports the cyst is completely gone. Denies any pain, redness, or swelling.   Pt requesting flu vaccine today.    Review of Systems  All other systems reviewed and are negative.      Objective:   Physical Exam  Constitutional: He is oriented to person, place, and time. He appears well-developed and well-nourished. No distress.  HENT:  Head: Normocephalic.  Cardiovascular: Normal rate, regular rhythm, normal heart sounds and intact distal pulses.  No murmur heard. Pulmonary/Chest: Effort normal and breath sounds normal. No respiratory distress. He has no wheezes.  Abdominal: Soft. Bowel sounds are normal. He exhibits no distension. There is no tenderness.  Musculoskeletal: Normal range of motion. He exhibits no edema or tenderness.  Neurological: He is alert and oriented to person, place, and time. He has normal reflexes. No cranial nerve deficit.  Skin: Skin is warm and dry. No rash noted. No erythema.  Psychiatric: He has a normal mood and affect. His behavior is normal. Judgment and thought content normal.  Vitals reviewed.     BP 115/76   Pulse 96   Temp (!) 97.4 F (36.3 C) (Oral)   Ht 5\' 10"  (1.778 m)   Wt 230 lb 3.2 oz (104.4 kg)   BMI 33.03 kg/m      Assessment & Plan:  William Galvan comes in today with chief complaint of recheck cyst on neck   Diagnosis and orders addressed:  1. Epidermal cyst of neck Resolved  Flu vaccine given today RTO as needed   Jannifer Rodney, FNP

## 2018-07-05 ENCOUNTER — Other Ambulatory Visit: Payer: Self-pay

## 2018-07-05 ENCOUNTER — Other Ambulatory Visit: Payer: Medicaid Other

## 2019-06-24 ENCOUNTER — Other Ambulatory Visit: Payer: Self-pay

## 2019-06-27 ENCOUNTER — Ambulatory Visit (INDEPENDENT_AMBULATORY_CARE_PROVIDER_SITE_OTHER): Payer: Medicaid Other | Admitting: Family Medicine

## 2019-06-27 ENCOUNTER — Other Ambulatory Visit: Payer: Self-pay

## 2019-06-27 ENCOUNTER — Encounter: Payer: Self-pay | Admitting: Family Medicine

## 2019-06-27 VITALS — BP 118/81 | HR 103 | Temp 98.6°F | Resp 20 | Ht 70.0 in | Wt 239.0 lb

## 2019-06-27 DIAGNOSIS — M545 Low back pain, unspecified: Secondary | ICD-10-CM

## 2019-06-27 DIAGNOSIS — S86892A Other injury of other muscle(s) and tendon(s) at lower leg level, left leg, initial encounter: Secondary | ICD-10-CM

## 2019-06-27 LAB — MICROSCOPIC EXAMINATION
Bacteria, UA: NONE SEEN
Epithelial Cells (non renal): NONE SEEN /hpf (ref 0–10)
RBC: NONE SEEN /hpf (ref 0–2)
Renal Epithel, UA: NONE SEEN /hpf
WBC, UA: NONE SEEN /hpf (ref 0–5)

## 2019-06-27 LAB — URINALYSIS, COMPLETE
Bilirubin, UA: NEGATIVE
Glucose, UA: NEGATIVE
Leukocytes,UA: NEGATIVE
Nitrite, UA: NEGATIVE
RBC, UA: NEGATIVE
Specific Gravity, UA: >1.03 — ABNORMAL HIGH (ref 1.005–1.030)
Urobilinogen, Ur: 0.2 mg/dL (ref 0.2–1.0)
pH, UA: 6 (ref 5.0–7.5)

## 2019-06-27 MED ORDER — DICLOFENAC SODIUM 1 % EX GEL: 2.0000 g | Freq: Four times a day (QID) | CUTANEOUS | 0 refills | Status: DC

## 2019-06-27 NOTE — Patient Instructions (Signed)
Shin Splints  Shin splints is a painful condition that is felt in the front or the side of your legs. The muscles, the cord-like structures that connect muscles to bones (tendons), and the thin layer of tissue that covers the shin bone get irritated (inflamed). It can be caused by activities or exercises that are intense. It may also be caused by sports that have sudden starts and stops. Follow these instructions at home: Medicines  Take over-the-counter and prescription medicines only as told by your doctor.  Do not drive or use heavy machinery while taking prescription pain medicine. Injury care      If told, put ice on the painful area. Icing can help to relieve pain and swelling. ? Put ice in a plastic bag. ? Place a towel between your skin and the bag. ? Leave the ice on for 20 minutes, 2-3 times per day.  If told, put heat on the painful area. Do this before exercises, or as told by your doctor. Heat can help to relax your muscles. Use the heat source that your doctor recommends, such as a moist heat pack or a heating pad. ? Place a towel between your skin and the heat source. ? Leave the heat on for 20-30 minutes. ? Take off the heat if your skin gets bright red. This is important.  Massage, stretch, and strengthen the shin area.  Wear compression socks as told by your doctor.  Raise (elevate) your legs above the level of your heart while you are sitting or lying down. Activity  Rest as needed. Return to activity slowly as told by your doctor.  Do not use your shins to support your body weight when you start exercising again. Try cycling or swimming.  Stop running if you have pain.  Warm up before you exercise.  Run on a flat and firm surface, if possible.  Change the intensity of your exercise slowly.  Increase your running distance slowly. For example, if you are running 5 miles this week, you should only add  mile to your run next week. General  instructions  Wear shoes that have good arch support and heel support. Your shoes should cushion and support you as you walk or run.  Change your athletic shoes every 6 months, or every 350-450 miles.  Keep all follow-up visits as told by your doctor. This is important. Contact a doctor if:  You do not feel better.  Your pain gets worse.  You have swelling in your lower leg that gets worse.  Your shin is red and feels warm. Get help right away if:  You have very bad pain.  You have trouble walking. Summary  Shin splints is a painful condition that is felt in the front or the side of your legs.  Medicines, rest, and ice may help control pain.  Return to activity slowly as told by your doctor.  Make sure to call your doctor if your problems continue or get worse. This information is not intended to replace advice given to you by your health care provider. Make sure you discuss any questions you have with your health care provider. Document Revised: 06/16/2017 Document Reviewed: 04/27/2017 Elsevier Patient Education  2020 [employer]  

## 2019-06-27 NOTE — Progress Notes (Signed)
Subjective:  Patient ID: William Galvan, male    DOB: 1990/09/26, 29 y.o.   MRN: [id number]  Patient Care Team: Sharion Balloon, FNP as PCP - General (Family Medicine)   Chief Complaint:  Urinary Tract Infection (low back ) and lower left leg pain   HPI: William Galvan is a 29 y.o. male presenting on 06/27/2019 for Urinary Tract Infection (low back ) and lower left leg pain   Back Pain This is a new problem. The current episode started in the past 7 days. The problem occurs intermittently. The problem has been waxing and waning since onset. The pain is present in the lumbar spine. The quality of the pain is described as aching. The pain does not radiate. The pain is at a severity of 3/10. The pain is mild. The symptoms are aggravated by bending, twisting and position. Associated symptoms include leg pain. Pertinent negatives include no abdominal pain, bladder incontinence, bowel incontinence, chest pain, dysuria, fever, headaches, numbness, paresis, paresthesias, pelvic pain, perianal numbness, tingling, weakness or weight loss. He has tried analgesics for the symptoms. The treatment provided mild relief.  Leg Pain  The incident occurred 2 days ago. The incident occurred at home. There was no injury mechanism. The pain is present in the left leg. The pain is at a severity of 3/10. The pain is mild. The pain has been fluctuating since onset. Pertinent negatives include no inability to bear weight, loss of motion, loss of sensation, muscle weakness, numbness or tingling. He reports no foreign bodies present. The symptoms are aggravated by movement. He has tried acetaminophen for the symptoms. The treatment provided mild relief.     Relevant past medical, surgical, family, and social history reviewed and updated as indicated.  Allergies and medications reviewed and updated. Date reviewed: Chart in Epic.   Past Medical History:  Diagnosis Date  . Anxiety   . Autism     History reviewed. No  pertinent surgical history.  Social History   Socioeconomic History  . Marital status: Single    Spouse name: Not on file  . Number of children: Not on file  . Years of education: Not on file  . Highest education level: Not on file  Occupational History  . Not on file  Tobacco Use  . Smoking status: Never Smoker  . Smokeless tobacco: Never Used  Substance and Sexual Activity  . Alcohol use: No  . Drug use: No  . Sexual activity: Not on file  Other Topics Concern  . Not on file  Social History Narrative  . Not on file   Social Determinants of Health   Financial Resource Strain:   . Difficulty of Paying Living Expenses: Not on file  Food Insecurity:   . Worried About Charity fundraiser in the Last Year: Not on file  . Ran Out of Food in the Last Year: Not on file  Transportation Needs:   . Lack of Transportation (Medical): Not on file  . Lack of Transportation (Non-Medical): Not on file  Physical Activity:   . Days of Exercise per Week: Not on file  . Minutes of Exercise per Session: Not on file  Stress:   . Feeling of Stress : Not on file  Social Connections:   . Frequency of Communication with Friends and Family: Not on file  . Frequency of Social Gatherings with Friends and Family: Not on file  . Attends Religious Services: Not on file  . Active Member of  Clubs or Organizations: Not on file  . Attends Archivist Meetings: Not on file  . Marital Status: Not on file  Intimate Partner Violence:   . Fear of Current or Ex-Partner: Not on file  . Emotionally Abused: Not on file  . Physically Abused: Not on file  . Sexually Abused: Not on file    Outpatient Encounter Medications as of 06/27/2019  Medication Sig  . divalproex (DEPAKOTE) 500 MG DR tablet Take 2 tablets by mouth daily after supper.  . fluvoxaMINE (LUVOX) 50 MG tablet Take 1 tablet by mouth every evening.  Marland Kitchen LORazepam (ATIVAN) 0.5 MG tablet Take one tablet as needed for panic attack. No more  than 1 a day or 5 in one month. as needed  . mupirocin ointment (BACTROBAN) 2 % Apply 1 application topically 2 (two) times daily.  . [employer] MELATONIN 3 MG TABS Take 1 tablet by mouth.  . diclofenac Sodium (VOLTAREN) 1 % GEL Apply 2 g topically 4 (four) times daily.   No facility-administered encounter medications on file as of 06/27/2019.    No Known Allergies  Review of Systems  Constitutional: Negative for activity change, appetite change, chills, diaphoresis, fatigue, fever, unexpected weight change and weight loss.  HENT: Negative.   Eyes: Negative.  Negative for photophobia and visual disturbance.  Respiratory: Negative for cough, chest tightness and shortness of breath.   Cardiovascular: Negative for chest pain, palpitations and leg swelling.  Gastrointestinal: Negative for abdominal pain, blood in stool, bowel incontinence, constipation, diarrhea, nausea and vomiting.  Endocrine: Negative.   Genitourinary: Negative for bladder incontinence, decreased urine volume, difficulty urinating, dysuria, flank pain, frequency, hematuria, pelvic pain and urgency.  Musculoskeletal: Positive for arthralgias, back pain and myalgias. Negative for gait problem, joint swelling, neck pain and neck stiffness.  Skin: Negative.   Allergic/Immunologic: Negative.   Neurological: Negative for dizziness, tingling, tremors, seizures, syncope, facial asymmetry, speech difficulty, weakness, light-headedness, numbness, headaches and paresthesias.  Hematological: Negative.   Psychiatric/Behavioral: Negative for confusion, hallucinations, sleep disturbance and suicidal ideas.  All other systems reviewed and are negative.       Objective:  BP 118/81   Pulse (!) 103   Temp 98.6 F (37 C)   Resp 20   Ht _0  (1.778 m)   Wt 239 lb (108.4 kg)   SpO2 97%   BMI 34.29 kg/m    Wt Readings from Last 3 Encounters:  06/27/19 239 lb (108.4 kg)  01/28/18 230 lb 3.2 oz (104.4 kg)  01/18/18 226 lb 9.6 oz (102.8  kg)    Physical Exam Vitals and nursing note reviewed.  Constitutional:      General: He is not in acute distress.    Appearance: Normal appearance. He is well-developed and well-groomed. He is obese. He is not ill-appearing, toxic-appearing or diaphoretic.  HENT:     Head: Normocephalic and atraumatic.     Jaw: There is normal jaw occlusion.     Right Ear: Hearing normal.     Left Ear: Hearing normal.     Nose: Nose normal.     Mouth/Throat:     Lips: Pink.     Mouth: Mucous membranes are moist.     Pharynx: Oropharynx is clear. Uvula midline.  Eyes:     General: Lids are normal.     Extraocular Movements: Extraocular movements intact.     Conjunctiva/sclera: Conjunctivae normal.     Pupils: Pupils are equal, round, and reactive to light.  Neck:  Thyroid: No thyroid mass, thyromegaly or thyroid tenderness.     Vascular: No carotid bruit or JVD.     Trachea: Trachea and phonation normal.  Cardiovascular:     Rate and Rhythm: Normal rate and regular rhythm.     Chest Wall: PMI is not displaced.     Pulses: Normal pulses.     Heart sounds: Normal heart sounds. No murmur. No friction rub. No gallop.   Pulmonary:     Effort: Pulmonary effort is normal. No respiratory distress.     Breath sounds: Normal breath sounds. No wheezing.  Abdominal:     General: Bowel sounds are normal. There is no distension or abdominal bruit.     Palpations: Abdomen is soft. There is no hepatomegaly or splenomegaly.     Tenderness: There is no abdominal tenderness. There is no right CVA tenderness or left CVA tenderness.     Hernia: No hernia is present.  Musculoskeletal:        General: Normal range of motion.     Cervical back: Normal, normal range of motion and neck supple.     Thoracic back: Normal.     Lumbar back: Tenderness present. No swelling, edema, deformity, signs of trauma, lacerations, spasms or bony tenderness. Normal range of motion. Negative right straight leg raise test and  negative left straight leg raise test. No scoliosis.       Back:     Right hip: Normal.     Left hip: Normal.     Right lower leg: No edema.     Left lower leg: Tenderness present. No edema.     Left ankle: Normal.       Legs:  Lymphadenopathy:     Cervical: No cervical adenopathy.  Skin:    General: Skin is warm and dry.     Capillary Refill: Capillary refill takes less than 2 seconds.     Coloration: Skin is not cyanotic, jaundiced or pale.     Findings: No rash.  Neurological:     General: No focal deficit present.     Mental Status: He is alert. Mental status is at baseline.     Cranial Nerves: Cranial nerves are intact. No cranial nerve deficit.     Sensory: Sensation is intact. No sensory deficit.     Motor: Motor function is intact. No weakness.     Coordination: Coordination is intact. Coordination normal.     Gait: Gait is intact. Gait normal.     Deep Tendon Reflexes: Reflexes are normal and symmetric. Reflexes normal.  Psychiatric:        Attention and Perception: Attention and perception normal.        Mood and Affect: Mood and affect normal.        Speech: Speech normal.        Behavior: Behavior normal. Behavior is cooperative.        Thought Content: Thought content normal.        Cognition and Memory: Cognition and memory normal.        Judgment: Judgment normal.     Results for orders placed or performed in visit on 08/13/17  CMP14+EGFR  Result Value Ref Range   Glucose 94 65 - 99 mg/dL   BUN 8 6 - 20 mg/dL   Creatinine, Ser 0.97 0.76 - 1.27 mg/dL   GFR calc non Af Amer 106 >59 mL/min/1.73   GFR calc Af Amer 122 >59 mL/min/1.73   BUN/Creatinine Ratio 8 (L) 9 -  20   Sodium 141 134 - 144 mmol/L   Potassium 4.0 3.5 - 5.2 mmol/L   Chloride 100 96 - 106 mmol/L   CO2 23 20 - 29 mmol/L   Calcium 9.3 8.7 - 10.2 mg/dL   Total Protein 7.2 6.0 - 8.5 g/dL   Albumin 4.6 3.5 - 5.5 g/dL   Globulin, Total 2.6 1.5 - 4.5 g/dL   Albumin/Globulin Ratio 1.8 1.2 - 2.2    Bilirubin Total 0.4 0.0 - 1.2 mg/dL   Alkaline Phosphatase 69 39 - 117 IU/L   AST 39 0 - 40 IU/L   ALT 53 (H) 0 - 44 IU/L  CBC with Differential/Platelet  Result Value Ref Range   WBC 8.3 3.4 - 10.8 x10E3/uL   RBC 4.68 4.14 - 5.80 x10E6/uL   Hemoglobin 14.7 13.0 - 17.7 g/dL   Hematocrit 42.5 37.5 - 51.0 %   MCV 91 79 - 97 fL   MCH 31.4 26.6 - 33.0 pg   MCHC 34.6 31.5 - 35.7 g/dL   RDW 13.7 12.3 - 15.4 %   Platelets 268 150 - 379 x10E3/uL   Neutrophils 60 Not Estab. %   Lymphs 31 Not Estab. %   Monocytes 8 Not Estab. %   Eos 1 Not Estab. %   Basos 0 Not Estab. %   Neutrophils Absolute 5.0 1.4 - 7.0 x10E3/uL   Lymphocytes Absolute 2.6 0.7 - 3.1 x10E3/uL   Monocytes Absolute 0.7 0.1 - 0.9 x10E3/uL   EOS (ABSOLUTE) 0.0 0.0 - 0.4 x10E3/uL   Basophils Absolute 0.0 0.0 - 0.2 x10E3/uL   Immature Granulocytes 0 Not Estab. %   Immature Grans (Abs) 0.0 0.0 - 0.1 x10E3/uL  Lipid panel  Result Value Ref Range   Cholesterol, Total 207 (H) 100 - 199 mg/dL   Triglycerides 367 (H) 0 - 149 mg/dL   HDL 37 (L) >39 mg/dL   VLDL Cholesterol Cal 73 (H) 5 - 40 mg/dL   LDL Calculated 97 0 - 99 mg/dL   Chol/HDL Ratio 5.6 (H) 0.0 - 5.0 ratio  TSH  Result Value Ref Range   TSH 1.870 0.450 - 4.500 uIU/mL     urinalysis unremarkable in office  Pertinent labs & imaging results that were available during my care of the patient were reviewed by me and considered in my medical decision making.  Assessment & Plan:  William Galvan was seen today for urinary tract infection and lower left leg pain.  Diagnoses and all orders for this visit:  Acute right-sided low back pain without sciatica Urinalysis unremarkable in office. No red flags concerning for Cauda Equina Syndrome. Symptomatic care discussed in detail. Pt aware to report any new or worsening symptoms.  -     Urine Culture -     Urinalysis, Complete -     diclofenac Sodium (VOLTAREN) 1 % GEL; Apply 2 g topically 4 (four) times daily.  Left medial  tibial stress syndrome, initial encounter Symptomatic care discussed in detail. Report any new or worsening details. Medications as prescribed.  -     diclofenac Sodium (VOLTAREN) 1 % GEL; Apply 2 g topically 4 (four) times daily.     Continue all other maintenance medications.  Follow up plan: Return if symptoms worsen or fail to improve.  Continue healthy lifestyle choices, including diet (rich in fruits, vegetables, and lean proteins, and low in salt and simple carbohydrates) and exercise (at least 30 minutes of moderate physical activity daily).  Educational handout given for shin splints  The above assessment and management plan was discussed with the patient. The patient verbalized understanding of and has agreed to the management plan. Patient is aware to call the clinic if they develop any new symptoms or if symptoms persist or worsen. Patient is aware when to return to the clinic for a follow-up visit. Patient educated on when it is appropriate to go to the emergency department.   Monia Pouch, FNP-C [facility] [phone]

## 2019-06-28 LAB — URINE CULTURE: Organism ID, Bacteria: NO GROWTH

## 2019-08-18 ENCOUNTER — Ambulatory Visit (INDEPENDENT_AMBULATORY_CARE_PROVIDER_SITE_OTHER): Payer: Medicaid Other | Admitting: Family

## 2019-08-18 ENCOUNTER — Encounter: Payer: Self-pay | Admitting: Family

## 2019-08-18 ENCOUNTER — Other Ambulatory Visit: Payer: Self-pay

## 2019-08-18 DIAGNOSIS — F5101 Primary insomnia: Secondary | ICD-10-CM

## 2019-08-18 DIAGNOSIS — F411 Generalized anxiety disorder: Secondary | ICD-10-CM

## 2019-08-18 DIAGNOSIS — E781 Pure hyperglyceridemia: Secondary | ICD-10-CM

## 2019-08-18 DIAGNOSIS — L219 Seborrheic dermatitis, unspecified: Secondary | ICD-10-CM

## 2019-08-18 DIAGNOSIS — F840 Autistic disorder: Secondary | ICD-10-CM | POA: Diagnosis not present

## 2019-08-18 DIAGNOSIS — E559 Vitamin D deficiency, unspecified: Secondary | ICD-10-CM

## 2019-08-18 LAB — BAYER DCA HB A1C WAIVED: HB A1C (BAYER DCA - WAIVED): 5.5 % (ref <7.0)

## 2019-08-18 MED ORDER — KETOCONAZOLE 2 % EX SHAM: 1.0000 "application " | MEDICATED_SHAMPOO | CUTANEOUS | 0 refills | Status: DC

## 2019-08-18 MED ORDER — BETAMETHASONE DIPROPIONATE 0.05 % EX CREA: TOPICAL_CREAM | Freq: Two times a day (BID) | CUTANEOUS | 0 refills | Status: DC

## 2019-08-18 NOTE — Addendum Note (Signed)
Addended by: Prescott Gum on: 08/18/2019 10:55 AM   Modules accepted: Orders

## 2019-08-18 NOTE — Progress Notes (Signed)
Virtual Visit via telephone Note Due to COVID-19 pandemic this visit was conducted virtually. This visit type was conducted due to national recommendations for restrictions regarding the COVID-19 Pandemic (e.g. social distancing, sheltering in place) in an effort to limit this patient's exposure and mitigate transmission in our community. All issues noted in this document were discussed and addressed.  A physical exam was not performed with this format.  I connected with William Galvan on 08/18/19 at 9:31 AM  by telephone and verified that I am speaking with the correct person using two identifiers. William Galvan is currently located at car and his mother   is currently with him during visit. The provider, Evelina Dun, FNP is located in their office at time of visit.  I discussed the limitations, risks, security and privacy concerns of performing an evaluation and management service by telephone and the availability of in person appointments. I also discussed with the patient that there may be a patient responsible charge related to this service. The patient expressed understanding and agreed to proceed.   History and Present Illness:  Pt calls the office today for chronic follow up. He is autistic and lives with his mother.  He is followed by [facility] every 3 months for GAD and Insomnia. He does need lab work.  Anxiety Presents for follow-up visit. Symptoms include depressed mood, excessive worry, insomnia, nervous/anxious behavior and restlessness. Symptoms occur occasionally. The severity of symptoms is moderate. The quality of sleep is good.    Insomnia Primary symptoms: difficulty falling asleep, frequent awakening.  The current episode started more than one year. The onset quality is gradual. The problem occurs intermittently. The problem has been waxing and waning since onset.  Rash This is a recurrent problem. The current episode started more than 1 month ago. The problem has  been waxing and waning since onset. The affected locations include the scalp and face. The rash is characterized by dryness, itchiness and pain. Past treatments include anti-itch cream. The treatment provided mild relief. His past medical history is significant for eczema.      Review of Systems  Skin: Positive for rash.  Psychiatric/Behavioral: The patient is nervous/anxious and has insomnia.   All other systems reviewed and are negative.    Observations/Objective: No SOB or distress noted   Assessment and Plan: William Galvan comes in today with chief complaint of No chief complaint on file.   Diagnosis and orders addressed:  1. Autistic disorder, active - CMP14+EGFR; Future - CBC with Differential/Platelet; Future - TSH; Future  2. GAD (generalized anxiety disorder) - CMP14+EGFR; Future - CBC with Differential/Platelet; Future - TSH; Future  3. Primary insomnia - CMP14+EGFR; Future - CBC with Differential/Platelet; Future - TSH; Future  4. Hypertriglyceridemia - CMP14+EGFR; Future - CBC with Differential/Platelet; Future - Lipid panel; Future - Bayer DCA Hb A1c Waived; Future - TSH; Future  5. Vitamin D deficiency - CMP14+EGFR; Future - CBC with Differential/Platelet; Future - TSH; Future - VITAMIN D 25 Hydroxy (Vit-D Deficiency, Fractures); Future  6. Seborrheic dermatitis - ketoconazole (NIZORAL) 2 % shampoo; Apply 1 application topically 2 (two) times a week.  Dispense: 120 mL; Refill: 0 - betamethasone dipropionate 0.05 % cream; Apply topically 2 (two) times daily. For no longer than 5 days  Dispense: 30 g; Refill: 0   Labs pending Health Maintenance reviewed Diet and exercise encouraged  Follow up plan: 6 months and keep [place]      I discussed the assessment and treatment plan  with the patient. The patient was provided an opportunity to ask questions and all were answered. The patient agreed with the plan and demonstrated an  understanding of the instructions.   The patient was advised to call back or seek an in-person evaluation if the symptoms worsen or if the condition fails to improve as anticipated.  The above assessment and management plan was discussed with the patient. The patient verbalized understanding of and has agreed to the management plan. Patient is aware to call the clinic if symptoms persist or worsen. Patient is aware when to return to the clinic for a follow-up visit. Patient educated on when it is appropriate to go to the emergency department.   Time call ended:  9:53 AM  I provided 22 minutes of non-face-to-face time during this encounter.    Evelina Dun, FNP

## 2019-08-19 ENCOUNTER — Telehealth: Payer: Self-pay | Admitting: Family

## 2019-08-19 ENCOUNTER — Telehealth: Payer: Self-pay | Admitting: *Deleted

## 2019-08-19 LAB — LIPID PANEL
Chol/HDL Ratio: 5.5 ratio — ABNORMAL HIGH (ref 0.0–5.0)
Cholesterol, Total: 205 mg/dL — ABNORMAL HIGH (ref 100–199)
HDL: 37 mg/dL — ABNORMAL LOW (ref >39)
LDL Chol Calc (NIH): 107 mg/dL — ABNORMAL HIGH (ref 0–99)
Triglycerides: 356 mg/dL — ABNORMAL HIGH (ref 0–149)
VLDL Cholesterol Cal: 61 mg/dL — ABNORMAL HIGH (ref 5–40)

## 2019-08-19 LAB — CMP14+EGFR
ALT: 43 IU/L (ref 0–44)
AST: 32 IU/L (ref 0–40)
Albumin/Globulin Ratio: 1.6 (ref 1.2–2.2)
Albumin: 4.4 g/dL (ref 4.1–5.2)
Alkaline Phosphatase: 73 IU/L (ref 39–117)
BUN/Creatinine Ratio: 11 (ref 9–20)
BUN: 10 mg/dL (ref 6–20)
Bilirubin Total: 0.3 mg/dL (ref 0.0–1.2)
CO2: 21 mmol/L (ref 20–29)
Calcium: 9.6 mg/dL (ref 8.7–10.2)
Chloride: 101 mmol/L (ref 96–106)
Creatinine, Ser: 0.88 mg/dL (ref 0.76–1.27)
GFR calc Af Amer: 133 mL/min/{1.73_m2} (ref >59)
GFR calc non Af Amer: 115 mL/min/{1.73_m2} (ref >59)
Globulin, Total: 2.7 g/dL (ref 1.5–4.5)
Glucose: 82 mg/dL (ref 65–99)
Potassium: 4 mmol/L (ref 3.5–5.2)
Sodium: 139 mmol/L (ref 134–144)
Total Protein: 7.1 g/dL (ref 6.0–8.5)

## 2019-08-19 LAB — CBC WITH DIFFERENTIAL/PLATELET
Basophils Absolute: 0 10*3/uL (ref 0.0–0.2)
Basos: 0 %
EOS (ABSOLUTE): 0.1 10*3/uL (ref 0.0–0.4)
Eos: 1 %
Hematocrit: 42.1 % (ref 37.5–51.0)
Hemoglobin: 14.5 g/dL (ref 13.0–17.7)
Immature Grans (Abs): 0 10*3/uL (ref 0.0–0.1)
Immature Granulocytes: 0 %
Lymphocytes Absolute: 2.8 10*3/uL (ref 0.7–3.1)
Lymphs: 39 %
MCH: 31.5 pg (ref 26.6–33.0)
MCHC: 34.4 g/dL (ref 31.5–35.7)
MCV: 92 fL (ref 79–97)
Monocytes Absolute: 0.7 10*3/uL (ref 0.1–0.9)
Monocytes: 10 %
Neutrophils Absolute: 3.6 10*3/uL (ref 1.4–7.0)
Neutrophils: 50 %
Platelets: 300 10*3/uL (ref 150–450)
RBC: 4.6 x10E6/uL (ref 4.14–5.80)
RDW: 13.4 % (ref 11.6–15.4)
WBC: 7.2 10*3/uL (ref 3.4–10.8)

## 2019-08-19 LAB — TSH: TSH: 2.67 u[IU]/mL (ref 0.450–4.500)

## 2019-08-19 LAB — VITAMIN D 25 HYDROXY (VIT D DEFICIENCY, FRACTURES): Vit D, 25-Hydroxy: 38.1 ng/mL (ref 30.0–100.0)

## 2019-08-19 MED ORDER — BETAMETHASONE VALERATE 0.1 % EX CREA: TOPICAL_CREAM | Freq: Two times a day (BID) | CUTANEOUS | 0 refills | Status: DC

## 2019-08-19 NOTE — Telephone Encounter (Signed)
betamethasone dipropionate 0.05 % cream not covered by insurance must try betamethasone valerate cream or triamcinolone cream.

## 2019-08-19 NOTE — Telephone Encounter (Signed)
Please review and advise recent blood work.

## 2019-08-19 NOTE — Telephone Encounter (Signed)
See Result note 

## 2019-08-19 NOTE — Telephone Encounter (Signed)
Rx changed and sent to pharmacy.  °

## 2020-02-09 ENCOUNTER — Encounter: Payer: Self-pay | Admitting: Family Medicine

## 2020-02-09 ENCOUNTER — Other Ambulatory Visit: Payer: Self-pay

## 2020-02-09 ENCOUNTER — Telehealth: Payer: Self-pay | Admitting: *Deleted

## 2020-02-09 ENCOUNTER — Ambulatory Visit (INDEPENDENT_AMBULATORY_CARE_PROVIDER_SITE_OTHER): Payer: Medicaid Other

## 2020-02-09 ENCOUNTER — Ambulatory Visit (INDEPENDENT_AMBULATORY_CARE_PROVIDER_SITE_OTHER): Payer: Medicaid Other | Admitting: Family Medicine

## 2020-02-09 VITALS — BP 123/80 | HR 106 | Temp 98.3°F | Ht 70.0 in | Wt 238.0 lb

## 2020-02-09 DIAGNOSIS — S86899A Other injury of other muscle(s) and tendon(s) at lower leg level, unspecified leg, initial encounter: Secondary | ICD-10-CM

## 2020-02-09 DIAGNOSIS — M79605 Pain in left leg: Secondary | ICD-10-CM

## 2020-02-09 MED ORDER — DICLOFENAC SODIUM 75 MG PO TBEC: 75.0000 mg | DELAYED_RELEASE_TABLET | Freq: Two times a day (BID) | ORAL | 2 refills | Status: DC

## 2020-02-09 NOTE — Telephone Encounter (Signed)
PA for diclofenac apprpved.  Confirmation [id number] W Prior Approval #:[id number] Status: APPROVED 02/09/2020 - 02/08/2021   Pharmacy aware.

## 2020-02-09 NOTE — Progress Notes (Signed)
Chief Complaint  Patient presents with  . Leg Pain    HPI  Patient presents today for onset about 7 months ago with pain in the left shin.  He was walking with his mother at [place] at the time when he first mentioned it.  It does not bother him when at rest but anytime he tries to do a significant amount of walking at all he will start having pain.  Pain is only in the anterior part of the lower left leg.  It does not affect the calf.  The diStal left leg is neurovascularly intact.  PMH: Smoking status noted ROS: Per HPI  Objective: BP 123/80   Pulse (!) 106   Temp 98.3 F (36.8 C) (Temporal)   Ht 5\' 10"  (1.778 m)   Wt 238 lb (108 kg)   BMI 34.15 kg/m  Gen: NAD, alert, cooperative with exam Ext: No edema, warm. FROM LLE. Tender at tibia anteriorly on the left. No edema or erythema.  Neuro: Alert and oriented, No gross deficits  Assessment and plan:  1. Pain of left lower extremity   2. Anterior shin splints     Meds ordered this encounter  Medications  . diclofenac (VOLTAREN) 75 MG EC tablet    Sig: Take 1 tablet (75 mg total) by mouth 2 (two) times daily. For muscle and  Joint pain    Dispense:  60 tablet    Refill:  2    Orders Placed This Encounter  Procedures  . DG Tibia/Fibula Left    Standing Status:   Future    Number of Occurrences:   1    Standing Expiration Date:   03/11/2020    Order Specific Question:   Reason for Exam (SYMPTOM  OR DIAGNOSIS REQUIRED)    Answer:   shin pain for 7 months    Order Specific Question:   Preferred imaging location?    Answer:   Internal    Order Specific Question:   Radiology Contrast Protocol - do NOT remove file path    Answer:   [email or web address]  . Ambulatory referral to Physical Therapy    Referral Priority:   Routine    Referral Type:   Physical Medicine    Referral Reason:   Specialty Services Required    Requested Specialty:   Physical Therapy    Number  of Visits Requested:   1    Follow up as needed.  03/13/2020, MD

## 2020-02-09 NOTE — Telephone Encounter (Signed)
Diclofenac 75 mg tab PA came in today - [facility] pt - [place] [facility]

## 2020-02-15 ENCOUNTER — Ambulatory Visit: Payer: Medicaid Other | Admitting: Family Medicine

## 2020-02-15 ENCOUNTER — Other Ambulatory Visit: Payer: Self-pay

## 2020-02-15 ENCOUNTER — Ambulatory Visit: Payer: Medicaid Other | Attending: Family Medicine | Admitting: Physical Therapy

## 2020-02-15 ENCOUNTER — Encounter: Payer: Self-pay | Admitting: Physical Therapy

## 2020-02-15 DIAGNOSIS — M79662 Pain in left lower leg: Secondary | ICD-10-CM | POA: Insufficient documentation

## 2020-02-15 NOTE — Therapy (Signed)
[facility] [address] [place], [place], [address] Phone: [phone]   Fax:  [phone]  Physical Therapy Evaluation  Patient Details  Name: William Galvan MRN: [id number] Date of Birth: 1990/08/25 Referring Provider (PT): Mechele Claude MD   Encounter Date: 02/15/2020   PT End of Session - 02/15/20 1203    Visit Number 1    Number of Visits 12    Date for PT Re-Evaluation 03/28/20    PT Start Time 1113    PT Stop Time 1137    PT Time Calculation (min) 24 min    Activity Tolerance Patient tolerated treatment well    Behavior During Therapy [facility] for tasks assessed/performed           Past Medical History:  Diagnosis Date  . Anxiety   . Autism     History reviewed. No pertinent surgical history.  There were no vitals filed for this visit.    Subjective Assessment - 02/15/20 1207    Subjective COVID-19 screen performed prior to patient entering clinic.  The patient presents to the clinic today with his Mother.  His CC is that of left lower leg pain that has been ongoing since about February and was noted after walking up an incline at a park.  His Mother states he had also been doing a knee extension exercise at a gym but is not sure that contributed to his pain.  He has had to back down on his walking due to pain.  Today, his pain is rated at a 4/10.  When he is walking and his pain starts to increase he sits down.    Patient is accompained by: Family member   Mother.   Pertinent History Autism.    How long can you walk comfortably? Up to a mile.    Diagnostic tests (-) X-ray.    Patient Stated Goals Walk without pain.    Currently in Pain? Yes    Pain Score 4     Pain Location Leg    Pain Orientation Left    Pain Descriptors / Indicators Shooting    Pain Type Chronic pain    Pain Onset More than a month ago    Aggravating Factors  Walking, especially up hills.    Pain Relieving Factors Rest.              [facility] PT  Assessment - 02/15/20 0001      Assessment   Medical Diagnosis Pain of left lower extremity.    Referring Provider (PT) Mechele Claude MD    Onset Date/Surgical Date --   February 2021.     Precautions   Precautions None      Restrictions   Weight Bearing Restrictions No      Balance Screen   Has the patient fallen in the past 6 months No    Has the patient had a decrease in activity level because of a fear of falling?  Yes    Is the patient reluctant to leave their home because of a fear of falling?  No      Home [employer] information [employer] manager residence      Prior Function   Level of Independence Independent      ROM / Strength   AROM / PROM / Strength AROM;Strength      AROM   Overall AROM Comments Full left knee and ankle range of motion.      Strength   Overall Strength  Comments Normal left ankle strength.      Palpation   Palpation comment No left knee tednerness.  CC is over left Tibilais Anterior.      Special Tests   Other special tests Normal left knee stability testing.      Ambulation/Gait   Gait Comments WNL.                      Objective measurements completed on examination: See above findings.                    PT Long Term Goals - 02/15/20 1226      PT LONG TERM GOAL #1   Title Independent with a HEP.    Baseline No knowledge of appropriate ther ex.    Time 6    Period Weeks    Status New      PT LONG TERM GOAL #2   Title Patient walk two miles with pain not > 2/10.    Baseline Patient's pain rise to high levels such that he has to stop walking at about one mile.    Time 6    Period Weeks    Status New                  Plan - 02/15/20 1220    Clinical Impression Statement The patient presenst to OPPT with his Mother with c/o left anterior lower leg pain.  Pain appears isolated to his Tibilia Anterior.  Pain is reproduced with walking, especially up hills.  His left LE strength and range of  motion is normal.  He wants to walk and hike without pain.  Patient will benefit from skilled physical therapy intervention to address pain.    Personal Factors and Comorbidities Comorbidity 1    Comorbidities Autism.    Examination-Activity Limitations Locomotion Level    Stability/Clinical Decision Making Evolving/Moderate complexity    Clinical Decision Making Low    Rehab Potential Excellent    PT Frequency 2x / week    PT Duration 6 weeks    PT Treatment/Interventions ADLs/Self Care Home Management;Cryotherapy;Electrical Stimulation;Ultrasound;Moist Heat;Therapeutic activities;Therapeutic exercise;Manual techniques;Passive range of motion    PT Next Visit Plan Combo e'stim/US to patient's left Tibialis Anterior f/b STW/M, IASTM, resisted left ankle dorsiflexion.    Consulted and Agree with Plan of Care Patient           Patient will benefit from skilled therapeutic intervention in order to improve the following deficits and impairments:  Pain, Decreased activity tolerance  Visit Diagnosis: Pain in left lower leg - Plan: PT plan of care cert/re-cert     Problem List Patient Active Problem List   Diagnosis Date Noted  . Hypertriglyceridemia 08/17/2017  . Primary insomnia 08/13/2017  . Autistic disorder, active 12/01/2014  . Vitamin D deficiency 06/04/2014  . GAD (generalized anxiety disorder) 11/15/2013    Aireonna Bauer, [place] MPT 02/15/2020, 12:29 PM  [facility] [address] [place], [place], [address] Phone: [phone]   Fax:  [phone]  Name: William Galvan MRN: [id number] Date of Birth: 1991/01/07

## 2020-02-27 ENCOUNTER — Ambulatory Visit: Payer: Medicaid Other | Attending: Family Medicine | Admitting: Physical Therapy

## 2020-02-27 ENCOUNTER — Encounter: Payer: Self-pay | Admitting: Physical Therapy

## 2020-02-27 ENCOUNTER — Other Ambulatory Visit: Payer: Self-pay

## 2020-02-27 DIAGNOSIS — M79662 Pain in left lower leg: Secondary | ICD-10-CM | POA: Diagnosis not present

## 2020-02-27 NOTE — Therapy (Signed)
[facility] [address] [place], [place], [address] Phone: [phone]   Fax:  [phone]  Physical Therapy Treatment  Patient Details  Name: William Galvan MRN: [id number] Date of Birth: 1990/05/31 Referring Provider (PT): Mechele Claude MD   Encounter Date: 02/27/2020   PT End of Session - 02/27/20 0820    Visit Number 2    Number of Visits 12    Date for PT Re-Evaluation 03/28/20    PT Start Time 0737    PT Stop Time 0818    PT Time Calculation (min) 41 min    Activity Tolerance Patient tolerated treatment well    Behavior During Therapy [facility] for tasks assessed/performed           Past Medical History:  Diagnosis Date  . Anxiety   . Autism     History reviewed. No pertinent surgical history.  There were no vitals filed for this visit.   Subjective Assessment - 02/27/20 0819    Subjective COVID 19 screening performed on patient upon arrival. Patient denied any complaints or pain upon arrival.    Pertinent History Autism.    How long can you walk comfortably? Up to a mile.    Diagnostic tests (-) X-ray.    Patient Stated Goals Walk without pain.    Currently in Pain? No/denies              [facility] PT Assessment - 02/27/20 0001      Assessment   Medical Diagnosis Pain of left lower extremity.    Referring Provider (PT) Mechele Claude MD      Precautions   Precautions None      Restrictions   Weight Bearing Restrictions No                         [facility] Adult PT Treatment/Exercise - 02/27/20 0001      Exercises   Exercises Ankle      Modalities   Modalities Electrical Stimulation;Ultrasound      Electrical Stimulation   Electrical Stimulation Location L anterior tibialis    Electrical Stimulation Action Pre-Mod    Electrical Stimulation Parameters 80-150 hz x10 min    Electrical Stimulation Goals Tone      Ultrasound   Ultrasound Location L anterior tibialis    Ultrasound Parameters  Combo 1.5 w/cm2, 100%, 1 mhz x10 min    Ultrasound Goals Other (Comment)   pain/tone     Manual Therapy   Manual Therapy Myofascial release    Myofascial Release IASTW of L anterior tibialis to reduce tone and intermittant pain that patient indicated   with AROM DF/PF     Ankle Exercises: Standing   Rocker Board 5 minutes    Heel Raises Both;20 reps    Toe Raise 20 reps    Other Standing Ankle Exercises L lunge off 8" step x20 reps      Ankle Exercises: Seated   Other Seated Ankle Exercises 4D ankle strengthening green theraband x20 reps each                       PT Long Term Goals - 02/15/20 1226      PT LONG TERM GOAL #1   Title Independent with a HEP.    Baseline No knowledge of appropriate ther ex.    Time 6    Period Weeks    Status New      PT LONG  TERM GOAL #2   Title Patient walk two miles with pain not > 2/10.    Baseline Patient's pain rise to high levels such that he has to stop walking at about one mile.    Time 6    Period Weeks    Status New                 Plan - 02/27/20 0626    Clinical Impression Statement Patient presented in clinic with reports of no current L LLE pain along L anterior tibialis. Patient guided through low level ROM and strengthening activities and continued to deny any pain. Combo [place] completed over L anterior tibialis followed by IASTW to reduce tone and intermittant pain experienced by patient. Increased tone palpable along L anterior tibialis. Normal modalities response noted following removal of the modalities. Patient educated that redness and soreness may present especially after IASTW session. Patient acknowledged understanding of education.    Personal Factors and Comorbidities Comorbidity 1    Comorbidities Autism.    Examination-Activity Limitations Locomotion Level    Stability/Clinical Decision Making Evolving/Moderate complexity    Rehab Potential Excellent    PT Frequency 2x / week    PT Duration 6  weeks    PT Treatment/Interventions ADLs/Self Care Home Management;Cryotherapy;Electrical Stimulation;Ultrasound;Moist Heat;Therapeutic activities;Therapeutic exercise;Manual techniques;Passive range of motion    PT Next Visit Plan Combo e'stim/US to patient's left Tibialis Anterior f/b STW/M, IASTM, resisted left ankle dorsiflexion.    Consulted and Agree with Plan of Care Patient           Patient will benefit from skilled therapeutic intervention in order to improve the following deficits and impairments:  Pain, Decreased activity tolerance  Visit Diagnosis: Pain in left lower leg     Problem List Patient Active Problem List   Diagnosis Date Noted  . Hypertriglyceridemia 08/17/2017  . Primary insomnia 08/13/2017  . Autistic disorder, active 12/01/2014  . Vitamin D deficiency 06/04/2014  . GAD (generalized anxiety disorder) 11/15/2013    Marvell Fuller, PTA 02/27/2020, 8:28 AM  [facility] [address] [place], [place], [address] Phone: [phone]   Fax:  [phone]  Name: William Galvan MRN: [id number] Date of Birth: 1991-04-19

## 2020-02-29 ENCOUNTER — Encounter: Payer: Self-pay | Admitting: Physical Therapy

## 2020-02-29 ENCOUNTER — Other Ambulatory Visit: Payer: Self-pay

## 2020-02-29 ENCOUNTER — Ambulatory Visit: Payer: Medicaid Other | Admitting: Physical Therapy

## 2020-02-29 DIAGNOSIS — M79662 Pain in left lower leg: Secondary | ICD-10-CM

## 2020-02-29 NOTE — Therapy (Signed)
[facility] [address] [place], [place], [address] Phone: [phone]   Fax:  [phone]  Physical Therapy Treatment  Patient Details  Name: Osinachi Navarrette MRN: [id number] Date of Birth: [birth year] Referring Provider (PT): Mechele Claude MD   Encounter Date: 02/29/2020   PT End of Session - 02/29/20 0938    Visit Number 3    Number of Visits 12    Date for PT Re-Evaluation 03/28/20    PT Start Time 0900    PT Stop Time 0946    PT Time Calculation (min) 46 min    Activity Tolerance Patient tolerated treatment well    Behavior During Therapy [facility] for tasks assessed/performed           Past Medical History:  Diagnosis Date  . Anxiety   . Autism     History reviewed. No pertinent surgical history.  There were no vitals filed for this visit.   Subjective Assessment - 02/29/20 1221    Subjective COVID 19 screening performed on patient upon arrival. Patient arrives with no pain.    Pertinent History Autism.    How long can you walk comfortably? Up to a mile.    Diagnostic tests (-) X-ray.    Patient Stated Goals Walk without pain.    Currently in Pain? No/denies              [facility] PT Assessment - 02/29/20 0001      Assessment   Medical Diagnosis Pain of left lower extremity.    Referring Provider (PT) Mechele Claude MD      Precautions   Precautions None      Restrictions   Weight Bearing Restrictions No                         [facility] Adult PT Treatment/Exercise - 02/29/20 0001      Exercises   Exercises Ankle      Modalities   Modalities Electrical Stimulation;Ultrasound      Electrical Stimulation   Electrical Stimulation Location L anterior tibialis    Electrical Stimulation Action pre-mod    Electrical Stimulation Parameters 80-150 hz x10 mins    Electrical Stimulation Goals Tone      Ultrasound   Ultrasound Location L anterior tibialis    Ultrasound Parameters combo 1.5 w/cm2  1 mhz  x10 mins    Ultrasound Goals Other (Comment);Pain   tone     Manual Therapy   Manual Therapy Myofascial release    Myofascial Release IASTW of L anterior tibialis to reduce tone and intermittant pain that patient indicated      Ankle Exercises: Aerobic   Nustep Level 3 x10 mins with UEs      Ankle Exercises: Standing   Rocker Board 3 minutes                       PT Long Term Goals - 02/15/20 1226      PT LONG TERM GOAL #1   Title Independent with a HEP.    Baseline No knowledge of appropriate ther ex.    Time 6    Period Weeks    Status New      PT LONG TERM GOAL #2   Title Patient walk two miles with pain not > 2/10.    Baseline Patient's pain rise to high levels such that he has to stop walking at about one mile.    Time  6    Period Weeks    Status New                 Plan - 02/29/20 3875    Clinical Impression Statement Patient arrives to physical therapy with no reports of increased pain. Patient was guided through TEs with no reports of pain. Patient provided with intermittent verbal cuing to forward flexion with rockerboard with fair carryover for remaining reps. No adverse affects after completion of IASTM, combo US/E-stim and E-stim for tone.    Personal Factors and Comorbidities Comorbidity 1    Comorbidities Autism.    Examination-Activity Limitations Locomotion Level    Stability/Clinical Decision Making Evolving/Moderate complexity    Clinical Decision Making Low    Rehab Potential Excellent    PT Frequency 2x / week    PT Duration 6 weeks    PT Treatment/Interventions ADLs/Self Care Home Management;Cryotherapy;Electrical Stimulation;Ultrasound;Moist Heat;Therapeutic activities;Therapeutic exercise;Manual techniques;Passive range of motion    PT Next Visit Plan Combo e'stim/US to patient's left Tibialis Anterior f/b STW/M, IASTM, resisted left ankle dorsiflexion.    Consulted and Agree with Plan of Care Patient           Patient will  benefit from skilled therapeutic intervention in order to improve the following deficits and impairments:  Pain, Decreased activity tolerance  Visit Diagnosis: Pain in left lower leg     Problem List Patient Active Problem List   Diagnosis Date Noted  . Hypertriglyceridemia 08/17/2017  . Primary insomnia 08/13/2017  . Autistic disorder, active 12/01/2014  . Vitamin D deficiency 06/04/2014  . GAD (generalized anxiety disorder) 11/15/2013    Guss Bunde, PT, DPT 02/29/2020, 12:22 PM  [facility] [address] [place], [place], [address] Phone: [phone]   Fax:  [phone]  Name: Quinlin Conant MRN: [id number] Date of Birth: Sep 11, 1990

## 2020-03-06 ENCOUNTER — Encounter: Payer: Medicaid Other | Admitting: Family

## 2020-03-07 ENCOUNTER — Other Ambulatory Visit: Payer: Self-pay

## 2020-03-07 ENCOUNTER — Ambulatory Visit: Payer: Medicaid Other | Admitting: Physical Therapy

## 2020-03-07 DIAGNOSIS — M79662 Pain in left lower leg: Secondary | ICD-10-CM | POA: Diagnosis not present

## 2020-03-07 NOTE — Therapy (Signed)
[facility] [address] [place], [place], [address] Phone: [phone]   Fax:  [phone]  Physical Therapy Treatment  Patient Details  Name: William Galvan MRN: [id number] Date of Birth: 08-12-1990 Referring Provider (PT): Mechele Claude MD   Encounter Date: 03/07/2020   PT End of Session - 03/07/20 0902    Visit Number 4    Number of Visits 12    Date for PT Re-Evaluation 03/28/20    PT Start Time 0900    PT Stop Time 0944    PT Time Calculation (min) 44 min    Activity Tolerance Patient tolerated treatment well    Behavior During Therapy [facility] for tasks assessed/performed           Past Medical History:  Diagnosis Date   Anxiety    Autism     No past surgical history on file.  There were no vitals filed for this visit.   Subjective Assessment - 03/07/20 0901    Subjective COVID 19 screening performed on patient upon arrival. Patient reported no pain upon arrival    Pertinent History Autism.    How long can you walk comfortably? Up to a mile.    Diagnostic tests (-) X-ray.    Patient Stated Goals Walk without pain.    Currently in Pain? No/denies                             [facility] Adult PT Treatment/Exercise - 03/07/20 0001      Electrical Stimulation   Electrical Stimulation Location L anterior tibialis    Electrical Stimulation Action premod    Electrical Stimulation Parameters 80-150hz  x31min    Electrical Stimulation Goals Tone      Ultrasound   Ultrasound Location L tibialis anterior    Ultrasound Parameters combo 1.5w/cm2/44mhz x51min    Ultrasound Goals Other (Comment)   tone     Manual Therapy   Manual Therapy Myofascial release;Passive ROM    Myofascial Release IASTW of L anterior tibialis to reduce tone and intermittant pain that patient indicated    Passive ROM manual stretching for calf and DF to improve mobility and decrease tightness and tone      Ankle Exercises: Aerobic    Nustep Level 3 x10 mins with UEs                       PT Long Term Goals - 03/07/20 0903      PT LONG TERM GOAL #1   Title Independent with a HEP.    Baseline doing initial HEP as reported 03/07/20    Time 6    Period Weeks    Status On-going      PT LONG TERM GOAL #2   Title Patient walk two miles with pain not > 2/10.    Baseline Patient is only walking a mile per reported 03/07/20    Time 6    Period Weeks    Status On-going                 Plan - 03/07/20 0935    Clinical Impression Statement Patient tolerated treatment well per reported. Patient has only ben walking a mile at this time due to discomfort in tibialis anterior. Patient has reported doing HEP. Patient current goals progfressing slowly due to limitations with pain when walking.    Personal Factors and Comorbidities Comorbidity 1    Comorbidities Autism.  Examination-Activity Limitations Locomotion Level    Stability/Clinical Decision Making Evolving/Moderate complexity    Rehab Potential Excellent    PT Frequency 2x / week    PT Duration 6 weeks    PT Treatment/Interventions ADLs/Self Care Home Management;Cryotherapy;Electrical Stimulation;Ultrasound;Moist Heat;Therapeutic activities;Therapeutic exercise;Manual techniques;Passive range of motion    PT Next Visit Plan Combo e'stim/US to patient's left Tibialis Anterior f/b STW/M, IASTM, resisted left ankle dorsiflexion.    Consulted and Agree with Plan of Care Patient           Patient will benefit from skilled therapeutic intervention in order to improve the following deficits and impairments:  Pain, Decreased activity tolerance  Visit Diagnosis: Pain in left lower leg     Problem List Patient Active Problem List   Diagnosis Date Noted   Hypertriglyceridemia 08/17/2017   Primary insomnia 08/13/2017   Autistic disorder, active 12/01/2014   Vitamin D deficiency 06/04/2014   GAD (generalized anxiety disorder) 11/15/2013     Kyrstal Monterrosa P, PTA 03/07/2020, 9:47 AM  [facility] [address] [place], [place], [address] Phone: [phone]   Fax:  [phone]  Name: William Galvan MRN: [id number] Date of Birth: Jun 02, 1990

## 2020-03-13 ENCOUNTER — Ambulatory Visit: Payer: Medicaid Other | Admitting: Physical Therapy

## 2020-03-13 ENCOUNTER — Other Ambulatory Visit: Payer: Self-pay

## 2020-03-13 DIAGNOSIS — M79662 Pain in left lower leg: Secondary | ICD-10-CM | POA: Diagnosis not present

## 2020-03-13 NOTE — Therapy (Signed)
[facility] [address] [place], [place], [address] Phone: [phone]   Fax:  [phone]  Physical Therapy Treatment  Patient Details  Name: Abbas Beyene MRN: [id number] Date of Birth: Sep 24, 1990 Referring Provider (PT): Mechele Claude MD   Encounter Date: 03/13/2020   PT End of Session - 03/13/20 1002    Visit Number 5    Number of Visits 12    Date for PT Re-Evaluation 03/28/20    PT Start Time 0900    PT Stop Time 0943    PT Time Calculation (min) 43 min    Activity Tolerance Patient tolerated treatment well    Behavior During Therapy [facility] for tasks assessed/performed           Past Medical History:  Diagnosis Date  . Anxiety   . Autism     No past surgical history on file.  There were no vitals filed for this visit.   Subjective Assessment - 03/13/20 1046    Subjective COVID 19 screening performed on patient upon arrival. Patient arrives doing fairly well with no new complaints.    Pertinent History Autism.    How long can you walk comfortably? Up to a mile.    Diagnostic tests (-) X-ray.    Patient Stated Goals Walk without pain.    Currently in Pain? No/denies              [facility] PT Assessment - 03/13/20 0001      Assessment   Medical Diagnosis Pain of left lower extremity.    Referring Provider (PT) Mechele Claude MD      Precautions   Precautions None      Restrictions   Weight Bearing Restrictions No                         [facility] Adult PT Treatment/Exercise - 03/13/20 0001      Exercises   Exercises Ankle      Modalities   Modalities Ultrasound      Ultrasound   Ultrasound Location L anterior tib    Ultrasound Parameters combo 1.5 w/cm2 1 mhz x10 mins     Ultrasound Goals Other (Comment)   tone     Manual Therapy   Manual Therapy Myofascial release;Passive ROM    Myofascial Release IASTW of L anterior tibialis to reduce tone and intermittant pain that patient indicated       Ankle Exercises: Aerobic   Nustep Level 3 x10 mins with UEs      Ankle Exercises: Standing   Rocker Board 5 minutes    Heel Raises Both;20 reps    Toe Raise 20 reps    Other Standing Ankle Exercises step ups x20 reps    Other Standing Ankle Exercises Dynadisc DF/PF; IN/EV x20 each                       PT Long Term Goals - 03/07/20 3817      PT LONG TERM GOAL #1   Title Independent with a HEP.    Baseline doing initial HEP as reported 03/07/20    Time 6    Period Weeks    Status On-going      PT LONG TERM GOAL #2   Title Patient walk two miles with pain not > 2/10.    Baseline Patient is only walking a mile per reported 03/07/20    Time 6    Period Weeks  Status On-going                 Plan - 03/13/20 0950    Clinical Impression Statement Patient was able to tolerate treatment well with no reports of increased pain. Patient required verbal cuing and demonstration for proper technique for TEs. Circles with dynadisc attempted however unable to perform therefore terminated. No adverse affects upon completion of combo. IASTM with normal response.    Personal Factors and Comorbidities Comorbidity 1    Comorbidities Autism.    Examination-Activity Limitations Locomotion Level    Stability/Clinical Decision Making Evolving/Moderate complexity    Clinical Decision Making Low    Rehab Potential Excellent    PT Frequency 2x / week    PT Duration 6 weeks    PT Treatment/Interventions ADLs/Self Care Home Management;Cryotherapy;Electrical Stimulation;Ultrasound;Moist Heat;Therapeutic activities;Therapeutic exercise;Manual techniques;Passive range of motion    PT Next Visit Plan Combo e'stim/US to patient's left Tibialis Anterior f/b STW/M, IASTM, resisted left ankle dorsiflexion.    Consulted and Agree with Plan of Care Patient           Patient will benefit from skilled therapeutic intervention in order to improve the following deficits and impairments:   Pain, Decreased activity tolerance  Visit Diagnosis: Pain in left lower leg     Problem List Patient Active Problem List   Diagnosis Date Noted  . Hypertriglyceridemia 08/17/2017  . Primary insomnia 08/13/2017  . Autistic disorder, active 12/01/2014  . Vitamin D deficiency 06/04/2014  . GAD (generalized anxiety disorder) 11/15/2013    Guss Bunde, PT, DPT 03/13/2020, 10:52 AM  [facility] [address] [place], [place], [address] Phone: [phone]   Fax:  [phone]  Name: Britton Bera MRN: [id number] Date of Birth: [birth year]

## 2020-03-21 ENCOUNTER — Other Ambulatory Visit: Payer: Self-pay

## 2020-03-21 ENCOUNTER — Encounter: Payer: Self-pay | Admitting: Physical Therapy

## 2020-03-21 ENCOUNTER — Ambulatory Visit: Payer: Medicaid Other | Attending: Family Medicine | Admitting: Physical Therapy

## 2020-03-21 DIAGNOSIS — M79662 Pain in left lower leg: Secondary | ICD-10-CM | POA: Diagnosis present

## 2020-03-21 NOTE — Therapy (Signed)
[facility] [address] [place], [place], [address] Phone: [phone]   Fax:  [phone]  Physical Therapy Treatment  Patient Details  Name: William Galvan MRN: [id number] Date of Birth: 1990-10-31 Referring Provider (PT): Mechele Claude MD   Encounter Date: 03/21/2020   PT End of Session - 03/21/20 0918    Visit Number 6    Number of Visits 12    Date for PT Re-Evaluation 03/28/20    PT Start Time 0900    PT Stop Time 0942    PT Time Calculation (min) 42 min    Activity Tolerance Patient tolerated treatment well    Behavior During Therapy [facility] for tasks assessed/performed           Past Medical History:  Diagnosis Date  . Anxiety   . Autism     History reviewed. No pertinent surgical history.  There were no vitals filed for this visit.   Subjective Assessment - 03/21/20 0901    Subjective COVID 19 screening performed on patient upon arrival. Patient arrives doing fairly well with no new complaints.    Pertinent History Autism.    How long can you walk comfortably? Up to a mile.    Diagnostic tests (-) X-ray.    Patient Stated Goals Walk without pain.    Currently in Pain? No/denies              [facility] PT Assessment - 03/21/20 0001      Assessment   Medical Diagnosis Pain of left lower extremity.    Referring Provider (PT) Mechele Claude MD      Precautions   Precautions None      Restrictions   Weight Bearing Restrictions No                         [facility] Adult PT Treatment/Exercise - 03/21/20 0001      Manual Therapy   Manual Therapy Myofascial release;Passive ROM    Myofascial Release IASTW of L anterior tibialis to reduce tone and intermittant pain that patient indicated      Ankle Exercises: Aerobic   Nustep Level 3 x15 mins with UEs      Ankle Exercises: Standing   Rocker Board 3 minutes    Heel Raises Both;20 reps    Toe Raise 20 reps    Balance Beam tandem forward, sidestepping  x5 reps each    Other Standing Ankle Exercises wall squat x15 reps      Ankle Exercises: Stretches   Other Stretch Passive L anterior tib, evertors stretch 3x30 sec each                       PT Long Term Goals - 03/07/20 0903      PT LONG TERM GOAL #1   Title Independent with a HEP.    Baseline doing initial HEP as reported 03/07/20    Time 6    Period Weeks    Status On-going      PT LONG TERM GOAL #2   Title Patient walk two miles with pain not > 2/10.    Baseline Patient is only walking a mile per reported 03/07/20    Time 6    Period Weeks    Status On-going                 Plan - 03/21/20 1022    Clinical Impression Statement Patient presented in clinic  with no complaints of pain but has not returned to hiking at this time. No pain reported at rest or while walking. Patient also denied pain during therex of strengthening and stability. Increased tone palpable of L anterior tibialis with moderate redness response during IASTW. Patient denied any soreness after IASTW session.    Personal Factors and Comorbidities Comorbidity 1    Comorbidities Autism.    Examination-Activity Limitations Locomotion Level    Stability/Clinical Decision Making Evolving/Moderate complexity    Rehab Potential Excellent    PT Frequency 2x / week    PT Duration 6 weeks    PT Treatment/Interventions ADLs/Self Care Home Management;Cryotherapy;Electrical Stimulation;Ultrasound;Moist Heat;Therapeutic activities;Therapeutic exercise;Manual techniques;Passive range of motion    PT Next Visit Plan Combo e'stim/US to patient's left Tibialis Anterior f/b STW/M, IASTM, resisted left ankle dorsiflexion.    Consulted and Agree with Plan of Care Patient           Patient will benefit from skilled therapeutic intervention in order to improve the following deficits and impairments:  Pain, Decreased activity tolerance  Visit Diagnosis: Pain in left lower leg     Problem  List Patient Active Problem List   Diagnosis Date Noted  . Hypertriglyceridemia 08/17/2017  . Primary insomnia 08/13/2017  . Autistic disorder, active 12/01/2014  . Vitamin D deficiency 06/04/2014  . GAD (generalized anxiety disorder) 11/15/2013    Marvell Fuller, PTA 03/21/2020, 11:36 AM  [facility] [address] [place], [place], [address] Phone: [phone]   Fax:  [phone]  Name: William Galvan MRN: [id number] Date of Birth: October 11, 1990

## 2020-03-28 ENCOUNTER — Encounter: Payer: Self-pay | Admitting: Physical Therapy

## 2020-03-28 ENCOUNTER — Ambulatory Visit: Payer: Medicaid Other | Admitting: Physical Therapy

## 2020-03-28 ENCOUNTER — Other Ambulatory Visit: Payer: Self-pay

## 2020-03-28 DIAGNOSIS — M79662 Pain in left lower leg: Secondary | ICD-10-CM | POA: Diagnosis not present

## 2020-03-28 NOTE — Therapy (Signed)
[facility] [address] [place], [place], [address] Phone: [phone]   Fax:  [phone]  Physical Therapy Treatment  Patient Details  Name: William Galvan MRN: [id number] Date of Birth: [birth year] Referring Provider (PT): Mechele Claude MD   Encounter Date: 03/28/2020   PT End of Session - 03/28/20 0915    Visit Number 7    Number of Visits 12    Date for PT Re-Evaluation 03/28/20    Authorization Time Period 04/15/2020    PT Start Time 0900    PT Stop Time 0942    PT Time Calculation (min) 42 min    Activity Tolerance Patient tolerated treatment well    Behavior During Therapy [facility] for tasks assessed/performed           Past Medical History:  Diagnosis Date  . Anxiety   . Autism     History reviewed. No pertinent surgical history.  There were no vitals filed for this visit.   Subjective Assessment - 03/28/20 0914    Subjective COVID 19 screening performed on patient upon arrival. Patient with no new complaints. States he does not have pain right now.    Pertinent History Autism.    How long can you walk comfortably? Up to a mile.    Diagnostic tests (-) X-ray.    Patient Stated Goals Walk without pain.    Currently in Pain? No/denies              [facility] PT Assessment - 03/28/20 0001      Assessment   Medical Diagnosis Pain of left lower extremity.    Referring Provider (PT) Mechele Claude MD                         [facility] Adult PT Treatment/Exercise - 03/28/20 0001      Exercises   Exercises Ankle      Manual Therapy   Manual Therapy Myofascial release    Myofascial Release IASTW of L anterior tibialis to reduce tone and intermittant pain that patient indicated      Ankle Exercises: Aerobic   Nustep Level 4 x15 mins with UEs      Ankle Exercises: Standing   SLS alternating single leg stance 3-5 seconds with intermittent UE support x10 each    Rocker Board 3 minutes    Heel Raises Both;20  reps    Heel Raises Limitations --    Toe Raise 20 reps    Balance Beam tandem forward, sidestepping x3 minutes each                       PT Long Term Goals - 03/07/20 0903      PT LONG TERM GOAL #1   Title Independent with a HEP.    Baseline doing initial HEP as reported 03/07/20    Time 6    Period Weeks    Status On-going      PT LONG TERM GOAL #2   Title Patient walk two miles with pain not > 2/10.    Baseline Patient is only walking a mile per reported 03/07/20    Time 6    Period Weeks    Status On-going                 Plan - 03/28/20 0946    Clinical Impression Statement Patient responded well to therapy session but required constant cuing for form and technique. Patient  denied any increase of pain. SLS attempted but secondary to cognitive deficits short stance times performed over multiple reps rather than longer hold times. Good response to IASTM upon completion.    Personal Factors and Comorbidities Comorbidity 1    Comorbidities Autism.    Examination-Activity Limitations Locomotion Level    Stability/Clinical Decision Making Evolving/Moderate complexity    Clinical Decision Making Low    Rehab Potential Excellent    PT Frequency 2x / week    PT Duration 6 weeks    PT Treatment/Interventions ADLs/Self Care Home Management;Cryotherapy;Electrical Stimulation;Ultrasound;Moist Heat;Therapeutic activities;Therapeutic exercise;Manual techniques;Passive range of motion    PT Next Visit Plan Combo e'stim/US to patient's left Tibialis Anterior f/b STW/M, IASTM, resisted left ankle dorsiflexion.    Consulted and Agree with Plan of Care Patient           Patient will benefit from skilled therapeutic intervention in order to improve the following deficits and impairments:  Pain, Decreased activity tolerance  Visit Diagnosis: Pain in left lower leg     Problem List Patient Active Problem List   Diagnosis Date Noted  . Hypertriglyceridemia  08/17/2017  . Primary insomnia 08/13/2017  . Autistic disorder, active 12/01/2014  . Vitamin D deficiency 06/04/2014  . GAD (generalized anxiety disorder) 11/15/2013    Guss Bunde, PT, DPT 03/28/2020, 9:57 AM  [facility] [address] [place], [place], [address] Phone: [phone]   Fax:  [phone]  Name: William Galvan MRN: [id number] Date of Birth: 1990/06/26

## 2020-04-04 ENCOUNTER — Other Ambulatory Visit: Payer: Self-pay

## 2020-04-04 ENCOUNTER — Ambulatory Visit: Payer: Medicaid Other | Admitting: Physical Therapy

## 2020-04-04 DIAGNOSIS — M79662 Pain in left lower leg: Secondary | ICD-10-CM | POA: Diagnosis not present

## 2020-04-04 NOTE — Patient Instructions (Signed)
  Step Up Lower Body: Toe Rise   Standing, place feet apart. Hold arms out for balance or use support. Rise up on toes. Hold _3___ seconds, then lower. Repeat immediately. Repeat __10__ times. Do __2__ sessions per day.   Semi Tandem Standing   Heel of one foot against arch of other: 1.Look right _3__ times. 2. Look left _3__ times. 3. Look up __3_ times. 4. With right arm, reach right, left, forward and back. Repeat _2___ times. Do __2__ sessions per day.   Half Squat to Chair   Stand with feet shoulder width apart. Push buttocks backward and lower slowly, sitting in chair lightly and returning to standing position. Complete _2_ sets of 10_ repetitions. Perform __2-3_ sessions per day.   High Stepping   Using support, lift knees, taking high steps. Repeat __5-10__ times. Do __1-2__ sessions per day.    Bridging   Slowly raise buttocks from floor, keeping stomach tight. Repeat _10___ times per set. Do __2__ sets per session. Do __2__ sessions per day.

## 2020-04-04 NOTE — Therapy (Signed)
[facility] [place], [place], [address] Phone: [phone]   Fax:  [phone]  Physical Therapy Treatment  Patient Details  Name: William Galvan MRN: [id number] Date of Birth: 03-05-1991 Referring Provider (PT): Claretta Fraise MD   Encounter Date: 04/04/2020   PT End of Session - 04/04/20 0902    Visit Number 8    Number of Visits 12    Date for PT Re-Evaluation 03/28/20    Authorization Time Period 04/15/2020    PT Start Time 0900    PT Stop Time 0940    PT Time Calculation (min) 40 min    Activity Tolerance Patient tolerated treatment well    Behavior During Therapy [facility] for tasks assessed/performed           Past Medical History:  Diagnosis Date  . Anxiety   . Autism     No past surgical history on file.  There were no vitals filed for this visit.   Subjective Assessment - 04/04/20 0902    Subjective COVID 19 screening performed on patient upon arrival. Patient arrived with no complaints today.    Pertinent History Autism.    How long can you walk comfortably? Up to a mile.    Diagnostic tests (-) X-ray.    Patient Stated Goals Walk without pain.    Currently in Pain? No/denies                             [facility] Adult PT Treatment/Exercise - 04/04/20 0001      Ankle Exercises: Aerobic   Nustep Level 4 x15 mins with UEs      Ankle Exercises: Standing   SLS alternating single leg stance 3-5 seconds with intermittent UE support x10 each    Rocker Board 4 minutes    Heel Raises Both;20 reps    Toe Raise 20 reps    Balance Beam tandem forward, sidestepping x3 minutes each   tandem with arm movements and head turns for progression   Other Standing Ankle Exercises standing marching slow bace for balance 3x10    Other Standing Ankle Exercises sit to stands 2x10      Ankle Exercises: Seated   Other Seated Ankle Exercises seated ankle DF/PF with red band 3x10 each way      Ankle  Exercises: Supine   Other Supine Ankle Exercises supine hip bridge x20                  PT Education - 04/04/20 0910    Education Details HEP progression    Person(s) Educated Patient    Methods Explanation;Demonstration;Handout    Comprehension Verbalized understanding;Returned demonstration               PT Long Term Goals - 04/04/20 0905      PT LONG TERM GOAL #1   Title Independent with a HEP.    Baseline Met 04/04/20    Time 6    Period Weeks    Status Achieved      PT LONG TERM GOAL #2   Title Patient walk two miles with pain not > 2/10.    Baseline Patient reported walking "2 miles" and no pain today 04/04/20    Time 6    Period Weeks    Status Achieved                 Plan - 04/04/20 0916    Clinical  Impression Statement Patient has met all current goals today. Patient has reported no pain and walking 2 miles with no difficulty. Patient reported improvement from therapy. Today issued HEP progression and reviewed initial HEP. Patient has reported no pain today and 0/10 on Faces Pain scale. Patient has overall improved and responded well today.    Personal Factors and Comorbidities Comorbidity 1    Comorbidities Autism.    Examination-Activity Limitations Locomotion Level    Stability/Clinical Decision Making Evolving/Moderate complexity    Rehab Potential Excellent    PT Frequency 2x / week    PT Duration 6 weeks    PT Treatment/Interventions ADLs/Self Care Home Management;Cryotherapy;Electrical Stimulation;Ultrasound;Moist Heat;Therapeutic activities;Therapeutic exercise;Manual techniques;Passive range of motion    PT Next Visit Plan review HEP and DC next visit    Consulted and Agree with Plan of Care Patient           Patient will benefit from skilled therapeutic intervention in order to improve the following deficits and impairments:  Pain,Decreased activity tolerance  Visit Diagnosis: Pain in left lower leg     Problem  List Patient Active Problem List   Diagnosis Date Noted  . Hypertriglyceridemia 08/17/2017  . Primary insomnia 08/13/2017  . Autistic disorder, active 12/01/2014  . Vitamin D deficiency 06/04/2014  . GAD (generalized anxiety disorder) 11/15/2013    Phillips Climes, PTA 04/04/2020, 9:42 AM  [facility] [place], [place], [address] Phone: [phone]   Fax:  [phone]  Name: William Galvan MRN: [id number] Date of Birth: 13-Jun-1990

## 2020-04-11 ENCOUNTER — Ambulatory Visit: Payer: Medicaid Other | Admitting: Physical Therapy

## 2020-04-11 ENCOUNTER — Other Ambulatory Visit: Payer: Self-pay

## 2020-04-11 DIAGNOSIS — M79662 Pain in left lower leg: Secondary | ICD-10-CM

## 2020-04-11 NOTE — Therapy (Signed)
[facility] [place], [place], [address] Phone: [phone]   Fax:  [phone]  Physical Therapy Treatment PHYSICAL THERAPY DISCHARGE SUMMARY  Visits from Start of Care: 9  Current functional level related to goals / functional outcomes: See below   Remaining deficits: See goals   Education / Equipment: HEP Plan: Patient agrees to discharge.  Patient goals were met. Patient is being discharged due to meeting the stated rehab goals.  ?????    Gabriela Eves, PT, DPT    Patient Details  Name: William Galvan MRN: [id number] Date of Birth: [birth year] Referring Provider (PT): Claretta Fraise MD   Encounter Date: 04/11/2020   PT End of Session - 04/11/20 0908    Visit Number 9    Number of Visits 12    Date for PT Re-Evaluation 03/28/20    Authorization Time Period 04/15/2020    PT Start Time 0859    PT Stop Time 0939    PT Time Calculation (min) 40 min    Activity Tolerance Patient tolerated treatment well    Behavior During Therapy [facility] for tasks assessed/performed           Past Medical History:  Diagnosis Date  . Anxiety   . Autism     No past surgical history on file.  There were no vitals filed for this visit.   Subjective Assessment - 04/11/20 0906    Subjective COVID 19 screening performed on patient upon arrival. Patient arrived with no complaints today and overall doing good with no reported pain    Pertinent History Autism.    How long can you walk comfortably? Up to a mile.    Diagnostic tests (-) X-ray.    Patient Stated Goals Walk without pain.    Currently in Pain? No/denies                             [facility] Adult PT Treatment/Exercise - 04/11/20 0001      Ankle Exercises: Aerobic   Nustep Level 4 x15 mins with UEs      Ankle Exercises: Standing   SLS alternating single leg stance 3-5 seconds with intermittent UE support x10 each    Rocker Board 4 minutes     Heel Raises Both;20 reps    Toe Raise 20 reps    Balance Beam tandem forward, sidestepping/Back and Forward x3 minutes each    Other Standing Ankle Exercises standing marching slow bace for balance 3x10    Other Standing Ankle Exercises sit to stands 2x10      Ankle Exercises: Seated   Other Seated Ankle Exercises seated ankle DF/PF with red band 3x10 each way      Ankle Exercises: Supine   Other Supine Ankle Exercises supine hip bridge x20                       PT Long Term Goals - 04/04/20 0905      PT LONG TERM GOAL #1   Title Independent with a HEP.    Baseline Met 04/04/20    Time 6    Period Weeks    Status Achieved      PT LONG TERM GOAL #2   Title Patient walk two miles with pain not > 2/10.    Baseline Patient reported walking "2 miles" and no pain today 04/04/20    Time 6    Period Weeks  Status Achieved                 Plan - 04/11/20 0909    Clinical Impression Statement Patient has met all current goals and able to continue his walking program of two miles with no pain or difficulty. DC to HEP per PT.    Personal Factors and Comorbidities Comorbidity 1    Comorbidities Autism.    Examination-Activity Limitations Locomotion Level    Stability/Clinical Decision Making Evolving/Moderate complexity    Rehab Potential Excellent    PT Frequency 2x / week    PT Duration 6 weeks    PT Treatment/Interventions ADLs/Self Care Home Management;Cryotherapy;Electrical Stimulation;Ultrasound;Moist Heat;Therapeutic activities;Therapeutic exercise;Manual techniques;Passive range of motion    PT Next Visit Plan DC    Consulted and Agree with Plan of Care Patient           Patient will benefit from skilled therapeutic intervention in order to improve the following deficits and impairments:  Pain,Decreased activity tolerance  Visit Diagnosis: Pain in left lower leg     Problem List Patient Active Problem List   Diagnosis Date Noted  .  Hypertriglyceridemia 08/17/2017  . Primary insomnia 08/13/2017  . Autistic disorder, active 12/01/2014  . Vitamin D deficiency 06/04/2014  . GAD (generalized anxiety disorder) 11/15/2013    Ladean Raya, PTA 04/11/20 9:41 AM  [facility] [place], [place], [address] Phone: [phone]   Fax:  [phone]  Name: William Galvan MRN: [id number] Date of Birth: 02/28/1991

## 2020-05-07 ENCOUNTER — Other Ambulatory Visit: Payer: Self-pay | Admitting: Family Medicine

## 2020-06-16 ENCOUNTER — Other Ambulatory Visit: Payer: Self-pay | Admitting: Family Medicine

## 2020-09-12 ENCOUNTER — Other Ambulatory Visit: Payer: Self-pay

## 2020-09-12 ENCOUNTER — Encounter: Payer: Self-pay | Admitting: Family Medicine

## 2020-09-12 ENCOUNTER — Ambulatory Visit (INDEPENDENT_AMBULATORY_CARE_PROVIDER_SITE_OTHER): Payer: Medicaid Other | Admitting: Family Medicine

## 2020-09-12 VITALS — BP 115/73 | HR 97 | Temp 97.7°F | Ht 70.0 in | Wt 231.0 lb

## 2020-09-12 DIAGNOSIS — H6123 Impacted cerumen, bilateral: Secondary | ICD-10-CM | POA: Diagnosis not present

## 2020-09-12 MED ORDER — BETAMETHASONE VALERATE 0.1 % EX CREA: TOPICAL_CREAM | Freq: Two times a day (BID) | CUTANEOUS | 0 refills | Status: DC

## 2020-09-12 NOTE — Patient Instructions (Signed)

## 2020-09-12 NOTE — Progress Notes (Signed)
   Assessment & Plan:  1. Excessive cerumen in ear canal, bilateral Successful irrigation of bilateral ears.  Education provided on earwax buildup.   Follow up plan: Return if symptoms worsen or fail to improve.  William Boston, MSN, APRN, FNP-C [facility]  Subjective:   Patient ID: William Galvan, male    DOB: 10-24-1990, 30 y.o.   MRN: [id number]  HPI: [name] is a 30 y.o. male presenting on 09/12/2020 for right ear pain (X 1day/)  Patient is accompanied by his mom who he is okay with being present.  Patient reports right ear pain x1 day. Mom states he typically has to have his ears flushed out once a year.    ROS: Negative unless specifically indicated above in HPI.   Relevant past medical history reviewed and updated as indicated.   Allergies and medications reviewed and updated.   Current Outpatient Medications:  .  betamethasone valerate (VALISONE) 0.1 % cream, Apply topically 2 (two) times daily., Disp: 30 g, Rfl: 0 .  diclofenac (VOLTAREN) 75 MG EC tablet, TAKE 1 TABLET (75 MG TOTAL) BY MOUTH 2 (TWO) TIMES DAILY. FOR MUSCLE AND JOINT PAIN, Disp: 60 tablet, Rfl: 2 .  divalproex (DEPAKOTE) 500 MG DR tablet, Take 2 tablets by mouth daily after supper., Disp: , Rfl: 3 .  fluvoxaMINE (LUVOX) 50 MG tablet, Take 1 tablet by mouth every evening., Disp: , Rfl: 3 .  ketoconazole (NIZORAL) 2 % shampoo, Apply 1 application topically 2 (two) times a week., Disp: 120 mL, Rfl: 0 .  LORazepam (ATIVAN) 0.5 MG tablet, Take one tablet as needed for panic attack. No more than 1 a day or 5 in one month. as needed, Disp: , Rfl:   No Known Allergies  Objective:   BP 115/73   Pulse 97   Temp 97.7 F (36.5 C) (Temporal)   Ht 5\' 10"  (1.778 m)   Wt 231 lb (104.8 kg)   SpO2 94%   BMI 33.15 kg/m    Physical Exam Vitals reviewed.  Constitutional:      General: He is not in acute distress.    Appearance: Normal appearance. He is not ill-appearing,  toxic-appearing or diaphoretic.  HENT:     Head: Normocephalic and atraumatic.     Right Ear: Ear canal and external ear normal. There is impacted cerumen.     Left Ear: Ear canal and external ear normal. There is impacted cerumen.  Eyes:     General: No scleral icterus.       Right eye: No discharge.        Left eye: No discharge.     Conjunctiva/sclera: Conjunctivae normal.  Cardiovascular:     Rate and Rhythm: Normal rate.  Pulmonary:     Effort: Pulmonary effort is normal. No respiratory distress.  Musculoskeletal:        General: Normal range of motion.     Cervical back: Normal range of motion.  Skin:    General: Skin is warm and dry.  Neurological:     Mental Status: He is alert and oriented to person, place, and time. Mental status is at baseline.  Psychiatric:        Mood and Affect: Mood normal.        Behavior: Behavior normal.        Thought Content: Thought content normal.        Judgment: Judgment normal.

## 2020-09-24 ENCOUNTER — Other Ambulatory Visit: Payer: Self-pay | Admitting: Family

## 2021-01-21 ENCOUNTER — Other Ambulatory Visit: Payer: Self-pay | Admitting: Family Medicine

## 2021-01-24 ENCOUNTER — Ambulatory Visit: Payer: Medicaid Other | Admitting: Family

## 2021-01-24 ENCOUNTER — Encounter: Payer: Self-pay | Admitting: Family

## 2021-01-24 DIAGNOSIS — R509 Fever, unspecified: Secondary | ICD-10-CM

## 2021-01-24 DIAGNOSIS — Z20822 Contact with and (suspected) exposure to covid-19: Secondary | ICD-10-CM

## 2021-01-24 MED ORDER — BENZONATATE 200 MG PO CAPS: 200.0000 mg | ORAL_CAPSULE | Freq: Three times a day (TID) | ORAL | 1 refills | Status: DC | PRN

## 2021-01-24 MED ORDER — FLUTICASONE PROPIONATE 50 MCG/ACT NA SUSP: 2.0000 | Freq: Every day | NASAL | 6 refills | Status: DC

## 2021-01-24 NOTE — Progress Notes (Signed)
Virtual Visit  Note Due to COVID-19 pandemic this visit was conducted virtually. This visit type was conducted due to national recommendations for restrictions regarding the COVID-19 Pandemic (e.g. social distancing, sheltering in place) in an effort to limit this patient's exposure and mitigate transmission in our community. All issues noted in this document were discussed and addressed.  A physical exam was not performed with this format.  I connected with [name] on 01/24/21 at 4:17 pm  by telephone and verified that I am speaking with the correct person using two identifiers. [name] is currently located at home and mother is currently with him  during visit. The provider, Jannifer Rodney, FNP is located in their office at time of visit.  I discussed the limitations, risks, security and privacy concerns of performing an evaluation and management service by telephone and the availability of in person appointments. I also discussed with the patient that there may be a patient responsible charge related to this service. The patient expressed understanding and agreed to proceed.  Mr. William Galvan, William Galvan are scheduled for a virtual visit with your provider today.    Just as we do with appointments in the office, we must obtain your consent to participate.  Your consent will be active for this visit and any virtual visit you may have with one of our providers in the next 365 days.    If you have a MyChart account, I can also send a copy of this consent to you electronically.  All virtual visits are billed to your insurance company just like a traditional visit in the office.  As this is a virtual visit, video technology does not allow for your provider to perform a traditional examination.  This may limit your provider's ability to fully assess your condition.  If your provider identifies any concerns that need to be evaluated in person or the need to arrange testing such as labs, EKG, etc, we will make  arrangements to do so.    Although advances in technology are sophisticated, we cannot ensure that it will always work on either your end or our end.  If the connection with a video visit is poor, we may have to switch to a telephone visit.  With either a video or telephone visit, we are not always able to ensure that we have a secure connection.   I need to obtain your verbal consent now.   Are you willing to proceed with your visit today?   [name] has provided verbal consent on 01/24/2021 for a virtual visit (video or telephone).   Jannifer Rodney, [place] 01/24/2021  4:20 PM   History and Present Illness:  Mother calls for patient who is autistic. He started running a fever yesterday with mild congestion.   Fever  This is a new problem. The current episode started yesterday. The problem occurs intermittently. The problem has been unchanged. The maximum temperature noted was 100 to 100.9 F. Associated symptoms include congestion, coughing, sleepiness and a sore throat. Pertinent negatives include no diarrhea, headaches, nausea or vomiting. He has tried acetaminophen for the symptoms. The treatment provided mild relief.     Review of Systems  Constitutional:  Positive for fever.  HENT:  Positive for congestion and sore throat.   Respiratory:  Positive for cough.   Gastrointestinal:  Negative for diarrhea, nausea and vomiting.  Neurological:  Negative for headaches.  All other systems reviewed and are negative.   Observations/Objective: No SOB or distress noted  Assessment and Plan: 1. Fever, unspecified fever cause - fluticasone (FLONASE) 50 MCG/ACT nasal spray; Place 2 sprays into both nostrils daily.  Dispense: 16 g; Refill: 6 - benzonatate (TESSALON) 200 MG capsule; Take 1 capsule (200 mg total) by mouth 3 (three) times daily as needed.  Dispense: 30 capsule; Refill: 1  2. Encounter by telehealth for suspected COVID-19 Pt will take home COVID test and call us back and let us  know.  - Take meds as prescribed - Use a cool mist humidifier  -Use saline nose sprays frequently -Force fluids -For any cough or congestion  Use plain Mucinex- regular strength or max strength is fine -For fever or aces or pains- take tylenol or ibuprofen. -Throat lozenges if help - fluticasone (FLONASE) 50 MCG/ACT nasal spray; Place 2 sprays into both nostrils daily.  Dispense: 16 g; Refill: 6 - benzonatate (TESSALON) 200 MG capsule; Take 1 capsule (200 mg total) by mouth 3 (three) times daily as needed.  Dispense: 30 capsule; Refill: 1       I discussed the assessment and treatment plan with the patient. The patient was provided an opportunity to ask questions and all were answered. The patient agreed with the plan and demonstrated an understanding of the instructions.   The patient was advised to call back or seek an in-person evaluation if the symptoms worsen or if the condition fails to improve as anticipated.  The above assessment and management plan was discussed with the patient. The patient verbalized understanding of and has agreed to the management plan. Patient is aware to call the clinic if symptoms persist or worsen. Patient is aware when to return to the clinic for a follow-up visit. Patient educated on when it is appropriate to go to the emergency department.   Time call ended:  4:28 pm   I provided 11 minutes of  non face-to-face time during this encounter.    Jannifer Rodney, FNP

## 2021-01-25 ENCOUNTER — Telehealth: Payer: Self-pay | Admitting: Family

## 2021-01-25 MED ORDER — MOLNUPIRAVIR EUA 200MG CAPSULE: 4.0000 | ORAL_CAPSULE | Freq: Two times a day (BID) | ORAL | 0 refills | Status: AC

## 2021-01-25 NOTE — Telephone Encounter (Signed)
Patients mother aware  

## 2021-01-25 NOTE — Telephone Encounter (Signed)
Pts mom called to let William Galvan know that pt did test positive for COVID and needs William Galvan to go ahead and call him in some medicine as they discussed yesterday at visit.  Mom says pt has fever, cough, and congestion.

## 2021-01-25 NOTE — Telephone Encounter (Signed)
Molnupiravir Prescription sent to pharmacy. COVID positive, rest, force fluids, tylenol as needed, Quarantine for at least 5 days and you are fever free, then must wear a mask out in public from day 6-10, report any worsening symptoms such as increased shortness of breath, swelling, or continued high fevers. Possible adverse effects discussed with antivirals.

## 2021-05-01 IMAGING — DX DG TIBIA/FIBULA 2V*L*
4 series · 4 of 4 positions shown · non-contrast
Comparison: None.

CLINICAL DATA: Left foreleg pain

EXAM:
LEFT TIBIA AND FIBULA - 2 VIEW

[tibia ap (1 of 2)]
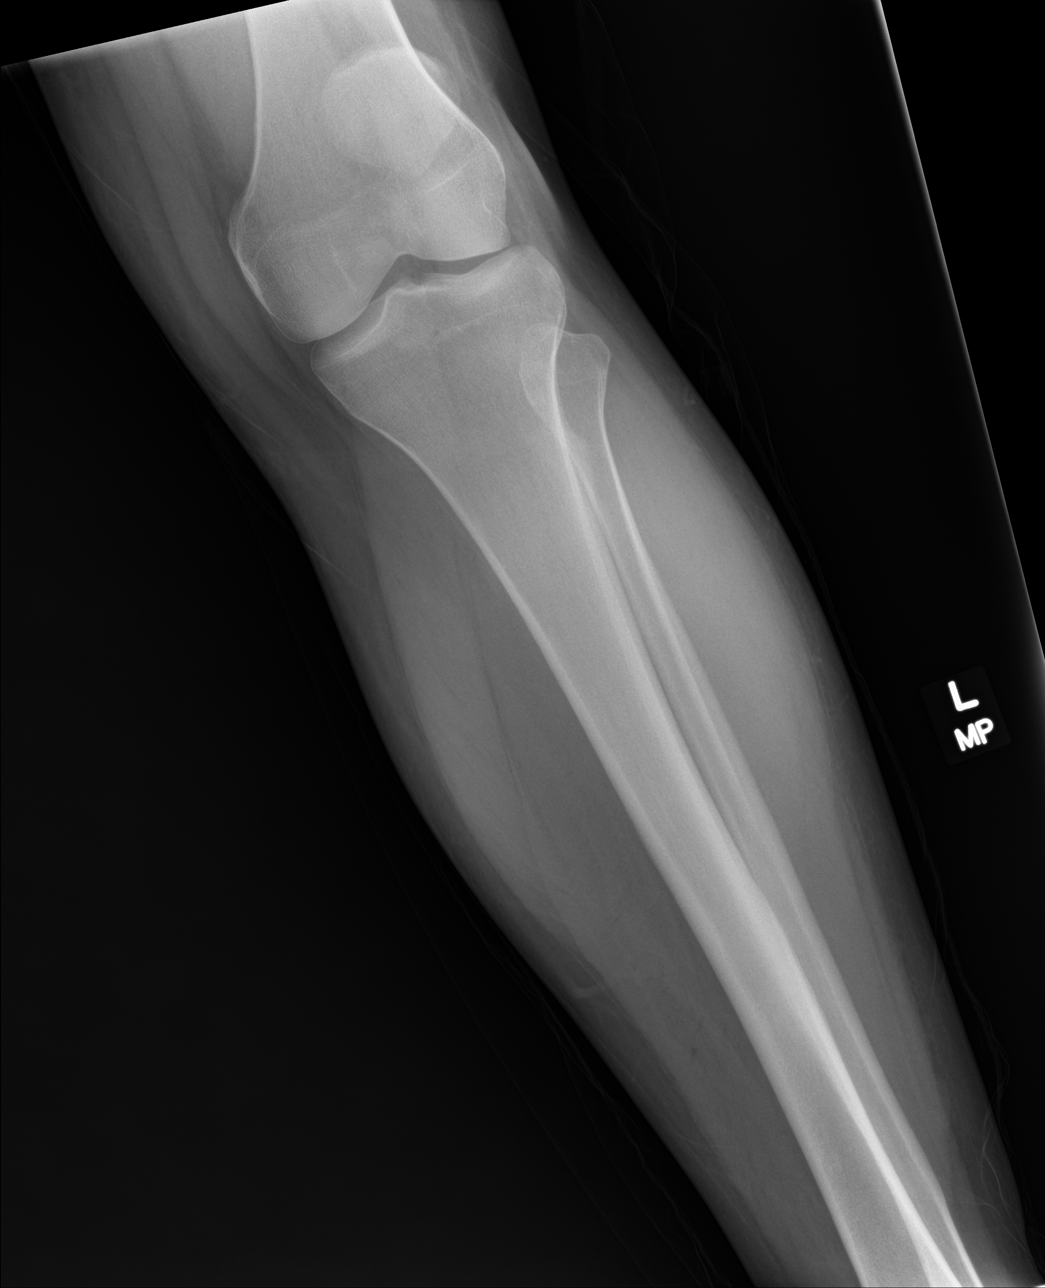

[tibia ap (2 of 2)]
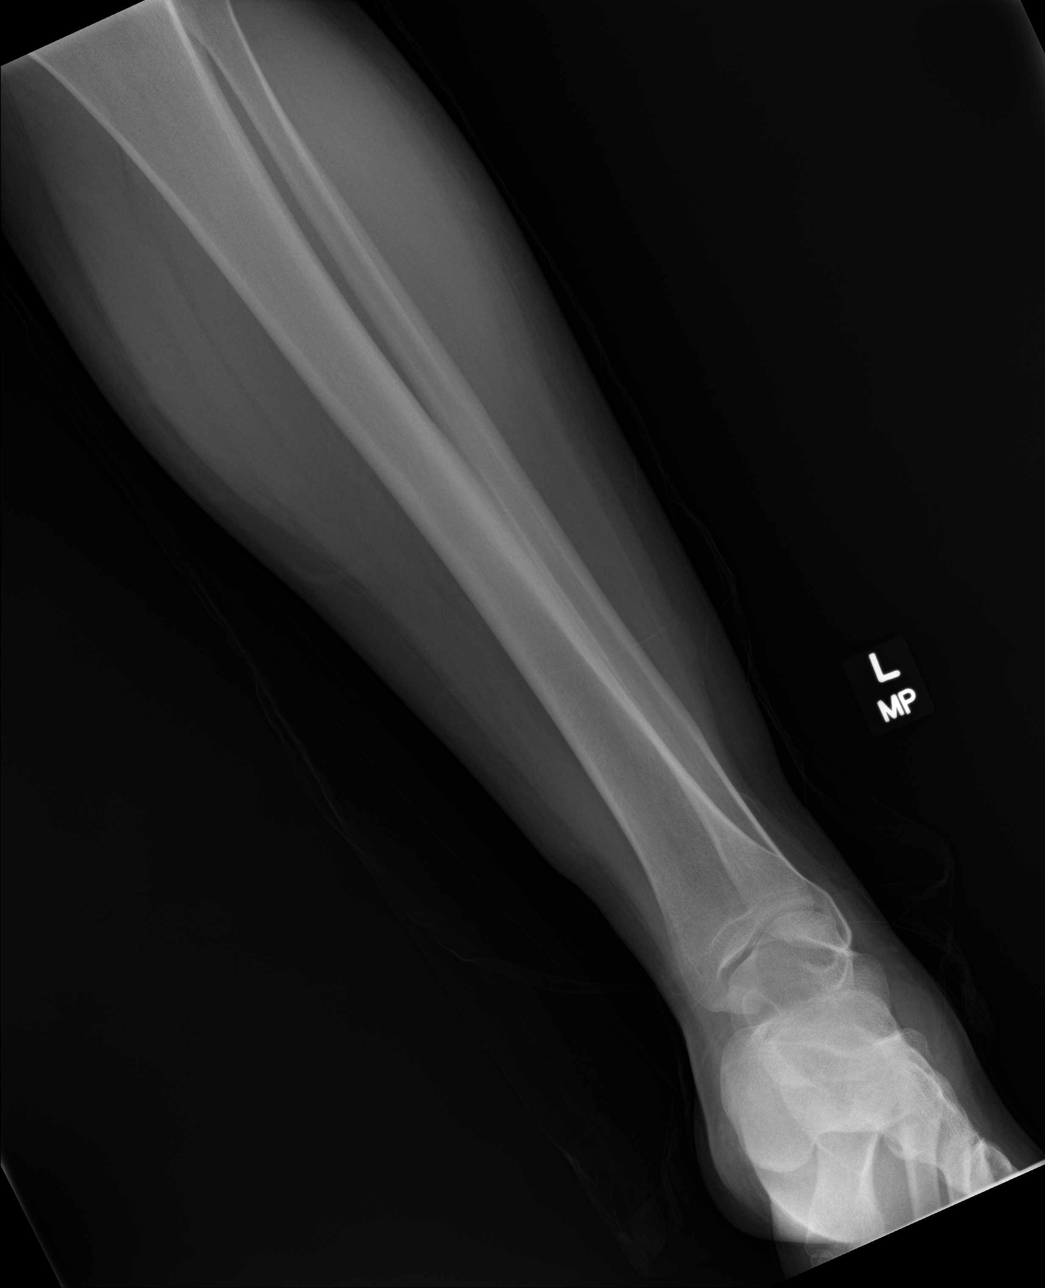

[tibia lat (1 of 2)]
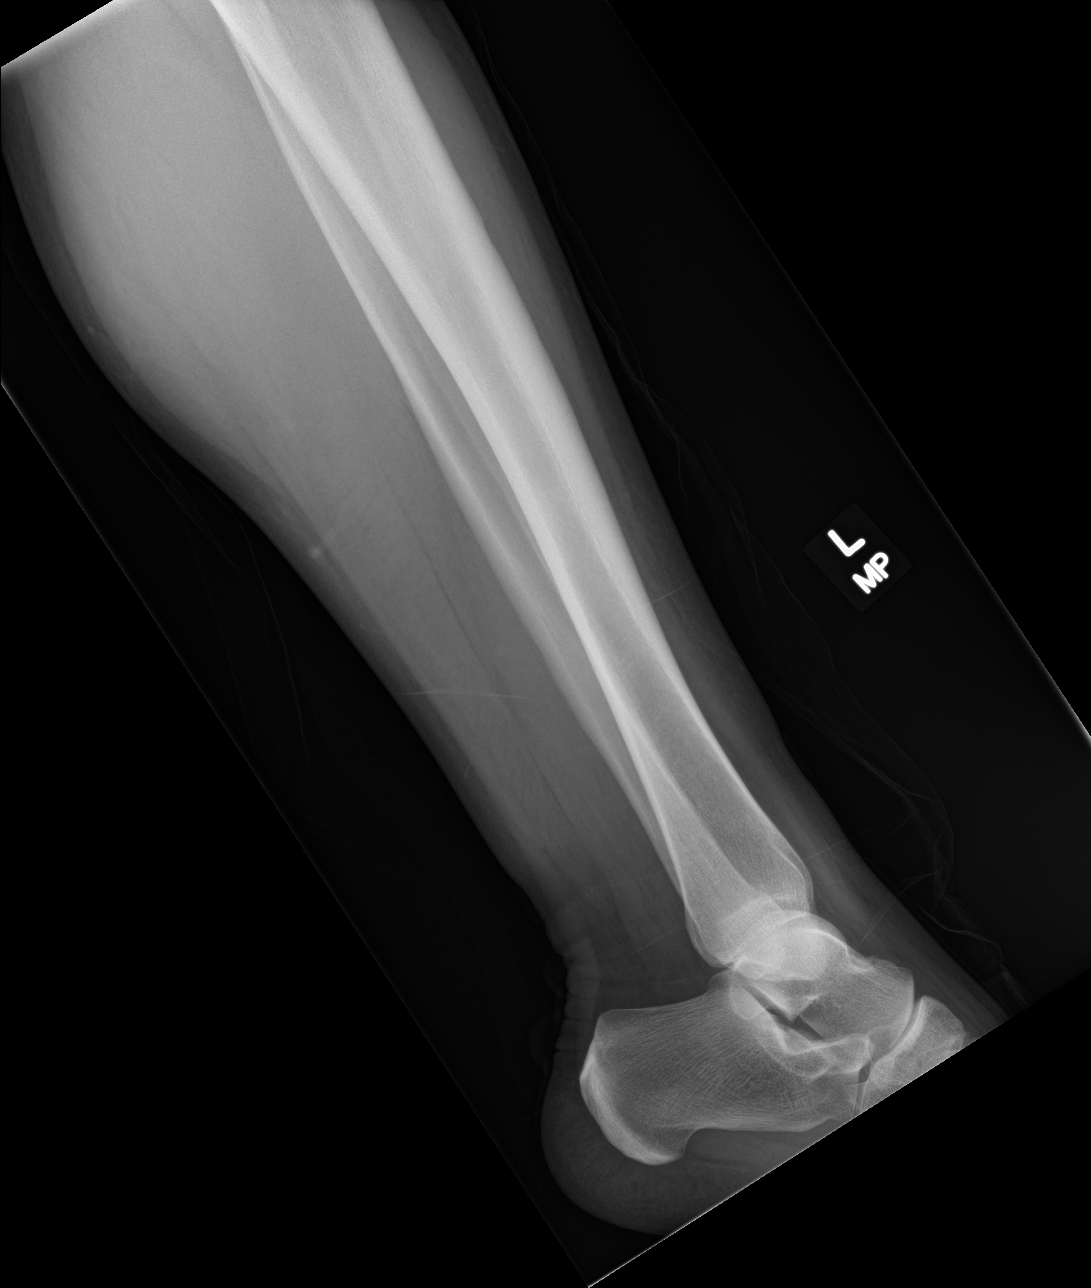

[tibia lat (2 of 2)]
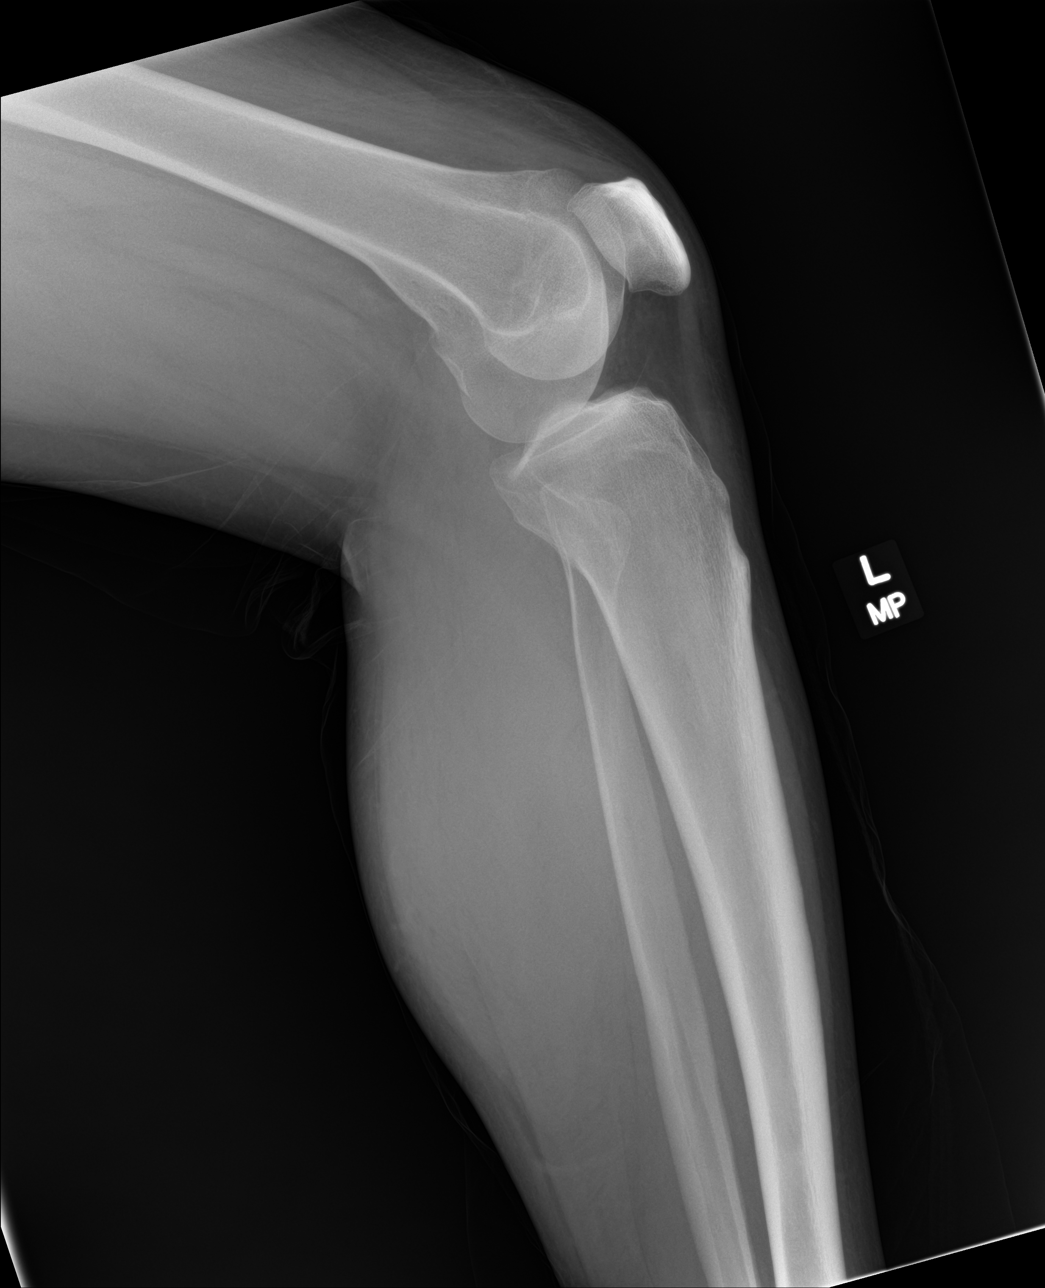

[4 of 4 positions shown; findings below may reference images not displayed]

FINDINGS: There is no evidence of fracture or other focal bone lesions. Soft
tissues are unremarkable.
IMPRESSION: Negative.

## 2021-08-27 ENCOUNTER — Ambulatory Visit (INDEPENDENT_AMBULATORY_CARE_PROVIDER_SITE_OTHER): Payer: Medicaid Other | Admitting: Family

## 2021-08-27 ENCOUNTER — Encounter: Payer: Self-pay | Admitting: Family

## 2021-08-27 VITALS — BP 122/78 | HR 113 | Ht 70.0 in | Wt 232.0 lb

## 2021-08-27 DIAGNOSIS — F411 Generalized anxiety disorder: Secondary | ICD-10-CM | POA: Diagnosis not present

## 2021-08-27 DIAGNOSIS — E781 Pure hyperglyceridemia: Secondary | ICD-10-CM

## 2021-08-27 DIAGNOSIS — F5101 Primary insomnia: Secondary | ICD-10-CM | POA: Diagnosis not present

## 2021-08-27 DIAGNOSIS — F840 Autistic disorder: Secondary | ICD-10-CM

## 2021-08-27 DIAGNOSIS — Z0001 Encounter for general adult medical examination with abnormal findings: Secondary | ICD-10-CM

## 2021-08-27 DIAGNOSIS — Z0000 Encounter for general adult medical examination without abnormal findings: Secondary | ICD-10-CM

## 2021-08-27 DIAGNOSIS — E559 Vitamin D deficiency, unspecified: Secondary | ICD-10-CM

## 2021-08-27 NOTE — Patient Instructions (Signed)
Health Maintenance, Male Adopting a healthy lifestyle and getting preventive care are important in promoting health and wellness. Ask your health care provider about: The right schedule for you to have regular tests and exams. Things you can do on your own to prevent diseases and keep yourself healthy. What should I know about diet, weight, and exercise? Eat a healthy diet  Eat a diet that includes plenty of vegetables, fruits, low-fat dairy products, and lean protein. Do not eat a lot of foods that are high in solid fats, added sugars, or sodium. Maintain a healthy weight Body mass index (BMI) is a measurement that can be used to identify possible weight problems. It estimates body fat based on height and weight. Your health care provider can help determine your BMI and help you achieve or maintain a healthy weight. Get regular exercise Get regular exercise. This is one of the most important things you can do for your health. Most adults should: Exercise for at least 150 minutes each week. The exercise should increase your heart rate and make you sweat (moderate-intensity exercise). Do strengthening exercises at least twice a week. This is in addition to the moderate-intensity exercise. Spend less time sitting. Even light physical activity can be beneficial. Watch cholesterol and blood lipids Have your blood tested for lipids and cholesterol at 32 years of age, then have this test every 5 years. You may need to have your cholesterol levels checked more often if: Your lipid or cholesterol levels are high. You are older than 32 years of age. You are at high risk for heart disease. What should I know about cancer screening? Many types of cancers can be detected early and may often be prevented. Depending on your health history and family history, you may need to have cancer screening at various ages. This may include screening for: Colorectal cancer. Prostate cancer. Skin cancer. Lung  cancer. What should I know about heart disease, diabetes, and high blood pressure? Blood pressure and heart disease High blood pressure causes heart disease and increases the risk of stroke. This is more likely to develop in people who have high blood pressure readings or are overweight. Talk with your health care provider about your target blood pressure readings. Have your blood pressure checked: Every 3-5 years if you are 18-39 years of age. Every year if you are 40 years old or older. If you are between the ages of 65 and 75 and are a current or former smoker, ask your health care provider if you should have a one-time screening for abdominal aortic aneurysm (AAA). Diabetes Have regular diabetes screenings. This checks your fasting blood sugar level. Have the screening done: Once every three years after age 45 if you are at a normal weight and have a low risk for diabetes. More often and at a younger age if you are overweight or have a high risk for diabetes. What should I know about preventing infection? Hepatitis B If you have a higher risk for hepatitis B, you should be screened for this virus. Talk with your health care provider to find out if you are at risk for hepatitis B infection. Hepatitis C Blood testing is recommended for: Everyone born from 1945 through 1965. Anyone with known risk factors for hepatitis C. Sexually transmitted infections (STIs) You should be screened each year for STIs, including gonorrhea and chlamydia, if: You are sexually active and are younger than 32 years of age. You are older than 32 years of age and your   health care provider tells you that you are at risk for this type of infection. Your sexual activity has changed since you were last screened, and you are at increased risk for chlamydia or gonorrhea. Ask your health care provider if you are at risk. Ask your health care provider about whether you are at high risk for HIV. Your health care provider  may recommend a prescription medicine to help prevent HIV infection. If you choose to take medicine to prevent HIV, you should first get tested for HIV. You should then be tested every 3 months for as long as you are taking the medicine. Follow these instructions at home: Alcohol use Do not drink alcohol if your health care provider tells you not to drink. If you drink alcohol: Limit how much you have to 0-2 drinks a day. Know how much alcohol is in your drink. In the [place] one drink equals one 12 oz bottle of beer (355 mL), one 5 oz glass of wine (148 mL), or one 1 oz glass of hard liquor (44 mL). Lifestyle Do not use any products that contain nicotine or tobacco. These products include cigarettes, chewing tobacco, and vaping devices, such as e-cigarettes. If you need help quitting, ask your health care provider. Do not use street drugs. Do not share needles. Ask your health care provider for help if you need support or information about quitting drugs. General instructions Schedule regular health, dental, and eye exams. Stay current with your vaccines. Tell your health care provider if: You often feel depressed. You have ever been abused or do not feel safe at home. Summary Adopting a healthy lifestyle and getting preventive care are important in promoting health and wellness. Follow your health care provider's instructions about healthy diet, exercising, and getting tested or screened for diseases. Follow your health care provider's instructions on monitoring your cholesterol and blood pressure. This information is not intended to replace advice given to you by your health care provider. Make sure you discuss any questions you have with your health care provider. Document Revised: 08/27/2020 Document Reviewed: 08/27/2020 Elsevier Patient Education  2023 Elsevier Inc.  

## 2021-08-27 NOTE — Progress Notes (Signed)
? ?Subjective:  ? ? Patient ID: William Galvan, male    DOB: 06-16-1990, 31 y.o.   MRN: [id number] ? ?Chief Complaint  ?Patient presents with  ? Medical Management of Chronic Issues  ?  CPE  ? ?PT presents to the office today for CPE . PT is followed by William Galvan every 3 months for GAD and autistic.   ?Anxiety ?Presents for follow-up visit. Symptoms include excessive worry, insomnia, irritability, nervous/anxious behavior and restlessness. Symptoms occur occasionally. The severity of symptoms is moderate. The quality of sleep is good.  ? ? ?Insomnia ?Primary symptoms: difficulty falling asleep, frequent awakening.   ?The current episode started more than one year. The onset quality is gradual. The problem occurs intermittently. The treatment provided mild relief.  ?Hyperlipidemia ?This is a chronic problem. The current episode started more than 1 year ago. The problem is controlled. Current antihyperlipidemic treatment includes diet change. The current treatment provides no improvement of lipids. Risk factors for coronary artery disease include dyslipidemia and male sex.  ? ? ? ?Review of Systems  ?Constitutional:  Positive for irritability.  ?Psychiatric/Behavioral:  The patient is nervous/anxious and has insomnia.   ?All other systems reviewed and are negative. ? ?   ?Family History  ?Problem Relation Age of Onset  ? Mental illness Sister   ? ?Social History  ? ?Socioeconomic History  ? Marital status: Single  ?  Spouse name: William Galvan  ? Number of children: William Galvan  ? Years of education: William Galvan  ? Highest education level: William Galvan  ?Occupational History  ? William Galvan  ?Tobacco Use  ? Smoking status: Never  ? Smokeless tobacco: Never  ?Vaping Use  ? Vaping Use: Never used  ?Substance and Sexual Activity  ? Alcohol use: No  ? Drug use: No  ? Sexual activity: William Galvan  ?Other Topics Concern  ? William Galvan  ?Social History Narrative  ? William Galvan  ? ?Social Determinants of Health  ? ?Financial Resource  Strain: William Galvan  ?Food Insecurity: William Galvan  ?Transportation Needs: William Galvan  ?Physical Activity: William Galvan  ?Stress: William Galvan  ?Social Connections: William Galvan  ? ? ?Objective:  ? Physical Exam ?Vitals reviewed.  ?Constitutional:   ?   General: He is William in acute distress. ?   Appearance: He is well-developed. He is obese.  ?HENT:  ?   Head: Normocephalic.  ?   Right Ear: Tympanic membrane normal.  ?   Left Ear: Tympanic membrane normal.  ?Eyes:  ?   General:     ?   Right eye: No discharge.     ?   Left eye: No discharge.  ?   Pupils: Pupils are equal, round, and reactive to light.  ?Neck:  ?   Thyroid: No thyromegaly.  ?Cardiovascular:  ?   Rate and Rhythm: Normal rate and regular rhythm.  ?   Heart sounds: Normal heart sounds. No murmur heard. ?Pulmonary:  ?   Effort: Pulmonary effort is normal. No respiratory distress.  ?   Breath sounds: Normal breath sounds. No wheezing.  ?Abdominal:  ?   General: Bowel sounds are normal. There is no distension.  ?   Palpations: Abdomen is soft.  ?   Tenderness: There is no abdominal tenderness.  ?Musculoskeletal:     ?   General: No tenderness. Normal range of motion.  ?   Cervical back: Normal  range of motion and neck supple.  ?Skin: ?   General: Skin is warm and dry.  ?   Findings: No erythema or rash.  ?Neurological:  ?   Mental Status: He is alert and oriented to person, place, and time.  ?   Cranial Nerves: No cranial nerve deficit.  ?   Deep Tendon Reflexes: Reflexes are normal and symmetric.  ?Psychiatric:     ?   Behavior: Behavior is hyperactive.     ?   Thought Content: Thought content normal.     ?   Judgment: Judgment is impulsive.  ? ? ?BP 122/78   Pulse (!) 113   Ht '5\' 10"'  (1.778 m)   Wt 232 lb (105.2 kg)   SpO2 96%   BMI 33.29 kg/m?  ? ? ? ?   ?Assessment & Plan:  ?William Galvan comes in today with chief complaint of Medical Management of Chronic Issues (CPE) ? ? ?Diagnosis and orders addressed: ? ?1. GAD (generalized anxiety disorder) ?-  CMP14+EGFR ?- CBC with Differential/Platelet ? ?2. Hypertriglyceridemia ?- CMP14+EGFR ?- CBC with Differential/Platelet ?- Lipid panel ? ?3. Primary insomnia ?- CMP14+EGFR ?- CBC with Differential/Platelet ? ?4. Vitamin D deficiency ?- CMP14+EGFR ?- CBC with Differential/Platelet ?- VITAMIN D 25 Hydroxy (Vit-D Deficiency, Fractures) ? ?5. Autistic disorder, active ?- CMP14+EGFR ?- CBC with Differential/Platelet ? ?6. Annual physical exam ?- CMP14+EGFR ?- CBC with Differential/Platelet ?- Lipid panel ?- TSH ?- Hepatitis C antibody ?- HIV Antibody (routine testing w rflx) ? ? ?Labs pending ?Health Maintenance reviewed ?Diet and exercise encouraged ? ?Follow up plan: ?1 year  ? ? ?William Dun, FNP ? ? ? ?

## 2021-08-28 LAB — CBC WITH DIFFERENTIAL/PLATELET
Basophils Absolute: 0 10*3/uL (ref 0.0–0.2)
Basos: 0 %
EOS (ABSOLUTE): 0 10*3/uL (ref 0.0–0.4)
Eos: 1 %
Hematocrit: 43.1 % (ref 37.5–51.0)
Hemoglobin: 15.5 g/dL (ref 13.0–17.7)
Immature Grans (Abs): 0 10*3/uL (ref 0.0–0.1)
Immature Granulocytes: 0 %
Lymphocytes Absolute: 2.2 10*3/uL (ref 0.7–3.1)
Lymphs: 38 %
MCH: 32.4 pg (ref 26.6–33.0)
MCHC: 36 g/dL — ABNORMAL HIGH (ref 31.5–35.7)
MCV: 90 fL (ref 79–97)
Monocytes Absolute: 0.5 10*3/uL (ref 0.1–0.9)
Monocytes: 8 %
Neutrophils Absolute: 3.2 10*3/uL (ref 1.4–7.0)
Neutrophils: 53 %
Platelets: 308 10*3/uL (ref 150–450)
RBC: 4.78 x10E6/uL (ref 4.14–5.80)
RDW: 12.8 % (ref 11.6–15.4)
WBC: 5.9 10*3/uL (ref 3.4–10.8)

## 2021-08-28 LAB — CMP14+EGFR
ALT: 34 IU/L (ref 0–44)
AST: 26 IU/L (ref 0–40)
Albumin/Globulin Ratio: 2 (ref 1.2–2.2)
Albumin: 4.7 g/dL (ref 4.0–5.0)
Alkaline Phosphatase: 64 IU/L (ref 44–121)
BUN/Creatinine Ratio: 10 (ref 9–20)
BUN: 9 mg/dL (ref 6–20)
Bilirubin Total: 0.4 mg/dL (ref 0.0–1.2)
CO2: 24 mmol/L (ref 20–29)
Calcium: 9.6 mg/dL (ref 8.7–10.2)
Chloride: 99 mmol/L (ref 96–106)
Creatinine, Ser: 0.88 mg/dL (ref 0.76–1.27)
Globulin, Total: 2.4 g/dL (ref 1.5–4.5)
Glucose: 85 mg/dL (ref 70–99)
Potassium: 4 mmol/L (ref 3.5–5.2)
Sodium: 139 mmol/L (ref 134–144)
Total Protein: 7.1 g/dL (ref 6.0–8.5)
eGFR: 117 mL/min/{1.73_m2} (ref >59)

## 2021-08-28 LAB — TSH: TSH: 2.24 u[IU]/mL (ref 0.450–4.500)

## 2021-08-28 LAB — LIPID PANEL
Chol/HDL Ratio: 5.5 ratio — ABNORMAL HIGH (ref 0.0–5.0)
Cholesterol, Total: 205 mg/dL — ABNORMAL HIGH (ref 100–199)
HDL: 37 mg/dL — ABNORMAL LOW (ref >39)
LDL Chol Calc (NIH): 103 mg/dL — ABNORMAL HIGH (ref 0–99)
Triglycerides: 381 mg/dL — ABNORMAL HIGH (ref 0–149)
VLDL Cholesterol Cal: 65 mg/dL — ABNORMAL HIGH (ref 5–40)

## 2021-08-28 LAB — HIV ANTIBODY (ROUTINE TESTING W REFLEX): HIV Screen 4th Generation wRfx: NONREACTIVE

## 2021-08-28 LAB — VITAMIN D 25 HYDROXY (VIT D DEFICIENCY, FRACTURES): Vit D, 25-Hydroxy: 55.9 ng/mL (ref 30.0–100.0)

## 2021-08-28 LAB — HEPATITIS C ANTIBODY: Hep C Virus Ab: NONREACTIVE

## 2022-02-20 ENCOUNTER — Other Ambulatory Visit: Payer: Self-pay | Admitting: Family

## 2022-02-20 DIAGNOSIS — R509 Fever, unspecified: Secondary | ICD-10-CM

## 2022-02-20 DIAGNOSIS — Z20822 Contact with and (suspected) exposure to covid-19: Secondary | ICD-10-CM

## 2022-03-12 ENCOUNTER — Ambulatory Visit (INDEPENDENT_AMBULATORY_CARE_PROVIDER_SITE_OTHER): Payer: Medicare (Managed Care) | Admitting: Family Medicine

## 2022-03-12 DIAGNOSIS — J329 Chronic sinusitis, unspecified: Secondary | ICD-10-CM | POA: Diagnosis not present

## 2022-03-12 MED ORDER — AMOXICILLIN-POT CLAVULANATE 875-125 MG PO TABS: 1.0000 | ORAL_TABLET | Freq: Two times a day (BID) | ORAL | 0 refills | Status: DC

## 2022-03-12 NOTE — Progress Notes (Signed)
Telephone visit  Subjective: CC: Cold PCP: Junie Spencer, FNP KGM:[id number] Yeatts is a 31 y.o. male calls for telephone consult today. Patient provides verbal consent for consult held via phone.  Due to COVID-19 pandemic this visit was conducted virtually. This visit type was conducted due to national recommendations for restrictions regarding the COVID-19 Pandemic (e.g. social distancing, sheltering in place) in an effort to limit this patient's exposure and mitigate transmission in our community. All issues noted in this document were discussed and addressed.  A physical exam was not performed with this format.   Location of patient: home Location of provider: [facility] Others present for call: mom  1. URI Patient started having sinus congestion and sore throat on Monday.  A family member is sick with similar.  Low grade fever 35F.  Took tylenol and OTC Cold and flu at night.  Using a nasal spray OTC.  Symptoms got worse yesterday and symptoms have been unchanged since yesterday.  No nausea, vomiting or diarrhea.    ROS: Per HPI  No Known Allergies Past Medical History:  Diagnosis Date   Anxiety    Autism     Current Outpatient Medications:    betamethasone valerate (VALISONE) 0.1 % cream, APPLY TOPICALLY TWICE A DAY, Disp: 30 g, Rfl: 0   diclofenac (VOLTAREN) 75 MG EC tablet, TAKE 1 TABLET (75 MG TOTAL) BY MOUTH 2 (TWO) TIMES DAILY. FOR MUSCLE AND JOINT PAIN, Disp: 60 tablet, Rfl: 2   divalproex (DEPAKOTE) 500 MG DR tablet, Take 2 tablets by mouth daily after supper., Disp: , Rfl: 3   fluticasone (FLONASE) 50 MCG/ACT nasal spray, SPRAY 2 SPRAYS INTO EACH NOSTRIL EVERY DAY, Disp: 48 mL, Rfl: 2   fluvoxaMINE (LUVOX) 50 MG tablet, Take 1 tablet by mouth every evening., Disp: , Rfl: 3   LORazepam (ATIVAN) 0.5 MG tablet, Take one tablet as needed for panic attack. No more than 1 a day or 5 in one month. as needed, Disp: , Rfl:   Assessment/ Plan: 31 y.o. male   Rhinosinusitis - Plan:  amoxicillin-clavulanate (AUGMENTIN) 875-125 MG tablet  Suspect that this is viral.  I encouraged him to COVID 19 test and call me back with results.  I am going to send a pocket prescription for Augmentin given the upcoming holiday weekend and closure of many offices.  I discussed with his mother signs and symptoms that would warrant initiation of this medication.  For now continue supportive care.  Hydrate.  Rest.  Start time: 10:15am End time: 10:21a  Total time spent on patient care (including telephone call/ virtual visit): 6 minutes  Rileigh Kawashima Hulen Skains, DO [facility] [phone]

## 2022-03-27 ENCOUNTER — Other Ambulatory Visit: Payer: Medicaid Other

## 2022-07-23 ENCOUNTER — Ambulatory Visit (INDEPENDENT_AMBULATORY_CARE_PROVIDER_SITE_OTHER): Payer: Medicare (Managed Care)

## 2022-07-23 VITALS — Ht 70.0 in | Wt 232.0 lb

## 2022-07-23 DIAGNOSIS — Z0000 Encounter for general adult medical examination without abnormal findings: Secondary | ICD-10-CM | POA: Diagnosis not present

## 2022-07-23 NOTE — Progress Notes (Signed)
Subjective:   William Galvan is a 32 y.o. male who presents for an Initial Medicare Annual Wellness Visit.  I connected with  Bertell Maria on 07/23/22 by a audio enabled telemedicine application and verified that I am speaking with the correct person using two identifiers.  Patient Location: Home  Provider Location: Home Office  I discussed the limitations of evaluation and management by telemedicine. The patient expressed understanding and agreed to proceed.  Review of Systems     Cardiac Risk Factors include: sedentary lifestyle     Objective:    Today's Vitals   07/23/22 1537  Weight: 232 lb (105.2 kg)  Height: 5\' 10"  (1.778 m)   Body mass index is 33.29 kg/m.     07/23/2022    3:43 PM 02/15/2020   12:06 PM  Advanced Directives  Does Patient Have a Medical Advance Directive? No No  Would patient like information on creating a medical advance directive? No - Patient declined     Current Medications (verified) Outpatient Encounter Medications as of 07/23/2022  Medication Sig   betamethasone valerate (VALISONE) 0.1 % cream APPLY TOPICALLY TWICE A DAY   diclofenac (VOLTAREN) 75 MG EC tablet TAKE 1 TABLET (75 MG TOTAL) BY MOUTH 2 (TWO) TIMES DAILY. FOR MUSCLE AND JOINT PAIN   divalproex (DEPAKOTE) 500 MG DR tablet Take 2 tablets by mouth daily after supper.   fluticasone (FLONASE) 50 MCG/ACT nasal spray SPRAY 2 SPRAYS INTO EACH NOSTRIL EVERY DAY   fluvoxaMINE (LUVOX) 50 MG tablet Take 1 tablet by mouth every evening.   LORazepam (ATIVAN) 0.5 MG tablet Take one tablet as needed for panic attack. No more than 1 a day or 5 in one month. as needed   [DISCONTINUED] amoxicillin-clavulanate (AUGMENTIN) 875-125 MG tablet Take 1 tablet by mouth 2 (two) times daily.   No facility-administered encounter medications on file as of 07/23/2022.    Allergies (verified) Patient has no known allergies.   History: Past Medical History:  Diagnosis Date   Anxiety    Autism    History  reviewed. No pertinent surgical history. Family History  Problem Relation Age of Onset   Mental illness Sister    Social History   Socioeconomic History   Marital status: Single    Spouse name: Not on file   Number of children: Not on file   Years of education: Not on file   Highest education level: Not on file  Occupational History   Not on file  Tobacco Use   Smoking status: Never   Smokeless tobacco: Never  Vaping Use   Vaping Use: Never used  Substance and Sexual Activity   Alcohol use: No   Drug use: No   Sexual activity: Not on file  Other Topics Concern   Not on file  Social History Narrative   Not on file   Social Determinants of Health   Financial Resource Strain: Low Risk  (07/23/2022)   Overall Financial Resource Strain (CARDIA)    Difficulty of Paying Living Expenses: Not hard at all  Food Insecurity: No Food Insecurity (07/23/2022)   Hunger Vital Sign    Worried About Running Out of Food in the Last Year: Never true    [place] in the Last Year: Never true  Transportation Needs: No Transportation Needs (07/23/2022)   PRAPARE - Hydrologist (Medical): No    Lack of Transportation (Non-Medical): No  Physical Activity: Insufficiently Active (07/23/2022)   Exercise Vital  Sign    Days of Exercise per Week: 3 days    Minutes of Exercise per Session: 30 min  Stress: No Stress Concern Present (07/23/2022)   [place]    Feeling of Stress : Not at all  Social Connections: Moderately Isolated (07/23/2022)   Social Connection and Isolation Panel [NHANES]    Frequency of Communication with Friends and Family: More than three times a week    Frequency of Social Gatherings with Friends and Family: More than three times a week    Attends Religious Services: 1 to 4 times per year    Active Member of [employer] or Organizations: No    Attends Music therapist: Never     Marital Status: Never married    Tobacco Counseling Counseling given: Not Answered   Clinical Intake:  Pre-visit preparation completed: Yes  Pain : No/denies pain  Diabetes: No  How often do you need to have someone help you when you read instructions, pamphlets, or other written materials from your doctor or pharmacy?: 1 - Never  Diabetic?No   Interpreter Needed?: No  Information entered by :: Denman George LPN   Activities of Daily Living    07/23/2022    3:43 PM  In your present state of health, do you have any difficulty performing the following activities:  Hearing? 0  Vision? 0  Difficulty concentrating or making decisions? 1  Walking or climbing stairs? 0  Dressing or bathing? 0  Doing errands, shopping? 0  Preparing Food and eating ? N  Using the Toilet? N  In the past six months, have you accidently leaked urine? N  Do you have problems with loss of bowel control? N  Managing your Medications? Y  Managing your Finances? Y  Housekeeping or managing your Housekeeping? Y    Patient Care Team: Sharion Balloon, FNP as PCP - General (Family Medicine)  Indicate any recent Medical Services you may have received from other than [facility] providers in the past year (date may be approximate).     Assessment:   This is a routine wellness examination for William Galvan.  Hearing/Vision screen Hearing Screening - Comments:: Denies hearing difficulties  Vision Screening - Comments:: up to date with routine eye exams    Dietary issues and exercise activities discussed: Current Exercise Habits: Home exercise routine, Type of exercise: walking, Time (Minutes): 30, Frequency (Times/Week): 5, Weekly Exercise (Minutes/Week): 150, Intensity: Mild   Goals Addressed             This Visit's Progress    DIET - INCREASE WATER INTAKE       Increase physical activity        Depression Screen    07/23/2022    3:40 PM 08/27/2021   12:40 PM 09/12/2020   10:40 AM 02/09/2020     9:40 AM 06/27/2019    8:04 AM 01/28/2018   10:44 AM 01/18/2018   10:25 AM  PHQ 2/9 Scores  PHQ - 2 Score 1 1 0 0 0 0 0  PHQ- 9 Score   5        Fall Risk    07/23/2022    3:39 PM 08/27/2021   12:40 PM 09/12/2020   10:39 AM 02/09/2020    9:40 AM 06/27/2019    8:04 AM  Fall Risk   Falls in the past year? 0 0 0 0 0  Number falls in past yr: 0   0  Injury with Fall? 0   0   Risk for fall due to : No Fall Risks   No Fall Risks   Follow up Falls prevention discussed;Education provided;Falls evaluation completed   Falls evaluation completed     FALL RISK PREVENTION PERTAINING TO THE HOME:  Any stairs in or around the home? Yes  If so, are there any without handrails? No  Home free of loose throw rugs in walkways, pet beds, electrical cords, etc? Yes  Adequate lighting in your home to reduce risk of falls? Yes   ASSISTIVE DEVICES UTILIZED TO PREVENT FALLS:  Life alert? No  Use of a cane, walker or w/c? No  Grab bars in the bathroom? No  Shower chair or bench in shower? No  Elevated toilet seat or a handicapped toilet? No   TIMED UP AND GO:  Was the test performed? No . Telephonic visit   Cognitive Function:        07/23/2022    3:44 PM  6CIT Screen  What Year? 0 points  What month? 0 points  What time? 0 points  Count back from 20 0 points  Months in reverse 2 points  Repeat phrase 0 points  Total Score 2 points    Immunizations Immunization History  Administered Date(s) Administered   Influenza,inj,Quad PF,6+ Mos 02/08/2013, 02/02/2014, 01/31/2015, 01/21/2016, 01/19/2017, 01/28/2018   Influenza-Unspecified 11/19/2016, 11/25/2017, 01/28/2018   Tdap 08/13/2017    TDAP status: Up to date  Flu Vaccine status: Up to date  Covid-19 vaccine status: Information provided on how to obtain vaccines.   Qualifies for Shingles Vaccine? No     Screening Tests Health Maintenance  Topic Date Due   COVID-19 Vaccine (1) 09/01/2022 (Originally 10/20/1990)   INFLUENZA VACCINE   11/20/2022   Medicare Annual Wellness (AWV)  07/23/2023   DTaP/Tdap/Td (2 - Td or Tdap) 08/14/2027   Hepatitis C Screening  Completed   HIV Screening  Completed   HPV VACCINES  Aged Out    Health Maintenance  There are no preventive care reminders to display for this patient.   Lung Cancer Screening: (Low Dose CT Chest recommended if Age 45-80 years, 30 pack-year currently smoking OR have quit w/in 15years.) does not qualify.   Lung Cancer Screening Referral: n/a  Additional Screening:  Hepatitis C Screening: does qualify; Completed 08/27/21  Vision Screening: Recommended annual ophthalmology exams for early detection of glaucoma and other disorders of the eye. Is the patient up to date with their annual eye exam?  Yes  Who is the provider or what is the name of the office in which the patient attends annual eye exams? Provider in [place]  If pt is not established with a provider, would they like to be referred to a provider to establish care? No .   Dental Screening: Recommended annual dental exams for proper oral hygiene  Community Resource Referral / Chronic Care Management: CRR required this visit?  No   CCM required this visit?  No      Plan:     I have personally reviewed and noted the following in the patient's chart:   Medical and social history Use of alcohol, tobacco or illicit drugs  Current medications and supplements including opioid prescriptions. Patient is not currently taking opioid prescriptions. Functional ability and status Nutritional status Physical activity Advanced directives List of other physicians Hospitalizations, surgeries, and ER visits in previous 12 months Vitals Screenings to include cognitive, depression, and falls Referrals and appointments  In addition,  I have reviewed and discussed with patient certain preventive protocols, quality metrics, and best practice recommendations. A written personalized care plan for preventive  services as well as general preventive health recommendations were provided to patient.     Vanetta Mulders, [place]   [id number]   Due to this being a virtual visit, the after visit summary with patients personalized plan was offered to patient via mail or my-chart.  Patient would like to access on my-chart  Nurse Notes: No concerns

## 2022-07-23 NOTE — Patient Instructions (Signed)
Mr. William Galvan , Thank you for taking time to come for your Medicare Wellness Visit. I appreciate your ongoing commitment to your health goals. Please review the following plan we discussed and let me know if I can assist you in the future.   These are the goals we discussed:  Goals      DIET - INCREASE WATER INTAKE     Increase physical activity        This is a list of the screening recommended for you and due dates:  Health Maintenance  Topic Date Due   COVID-19 Vaccine (1) 09/01/2022*   Flu Shot  11/20/2022   Medicare Annual Wellness Visit  07/23/2023   DTaP/Tdap/Td vaccine (2 - Td or Tdap) 08/14/2027   Hepatitis C Screening: USPSTF Recommendation to screen - Ages 27-79 yo.  Completed   HIV Screening  Completed   HPV Vaccine  Aged Out  *Topic was postponed. The date shown is not the original due date.    Advanced directives: antihistamineForms are available if you choose in the future to pursue completion.  This is recommended in order to make sure that your health wishes are honored in the event that you are unable to verbalize them to the provider.    Conditions/risks identified: Aim for 30 minutes of exercise or brisk walking, 6-8 glasses of water, and 5 servings of fruits and vegetables each day.   Next appointment: Follow up in one year for your annual wellness visit   Preventive Care 33-67 Years Old, Male Preventive care refers to lifestyle choices and visits with your health care provider that can promote health and wellness. Preventive care visits are also called wellness exams. What can I expect for my preventive care visit? Counseling During your preventive care visit, your health care provider may ask about your: Medical history, including: Past medical problems. Family medical history. Current health, including: Emotional well-being. Home life and relationship well-being. Sexual activity. Lifestyle, including: Alcohol, nicotine or tobacco, and drug use. Access to  firearms. Diet, exercise, and sleep habits. Safety issues such as seatbelt and bike helmet use. Sunscreen use. Work and work Statistician. Physical exam Your health care provider may check your: Height and weight. These may be used to calculate your BMI (body mass index). BMI is a measurement that tells if you are at a healthy weight. Waist circumference. This measures the distance around your waistline. This measurement also tells if you are at a healthy weight and may help predict your risk of certain diseases, such as type 2 diabetes and high blood pressure. Heart rate and blood pressure. Body temperature. Skin for abnormal spots. What immunizations do I need? Vaccines are usually given at various ages, according to a schedule. Your health care provider will recommend vaccines for you based on your age, medical history, and lifestyle or other factors, such as travel or where you work. What tests do I need? Screening Your health care provider may recommend screening tests for certain conditions. This may include: Lipid and cholesterol levels. Diabetes screening. This is done by checking your blood sugar (glucose) after you have not eaten for a while (fasting). Hepatitis B test. Hepatitis C test. HIV (human immunodeficiency virus) test. STI (sexually transmitted infection) testing, if you are at risk. Talk with your health care provider about your test results, treatment options, and if necessary, the need for more tests. Follow these instructions at home: Eating and drinking  Eat a healthy diet that includes fresh fruits and vegetables, whole grains,  lean protein, and low-fat dairy products. Drink enough fluid to keep your urine pale yellow. Take vitamin and mineral supplements as recommended by your health care provider. Do not drink alcohol if your health care provider tells you not to drink. If you drink alcohol: Limit how much you have to 0-2 drinks a day. Know how much alcohol  is in your drink. In the [place] one drink equals one 12 oz bottle of beer (355 mL), one 5 oz glass of wine (148 mL), or one 1 oz glass of hard liquor (44 mL). Lifestyle Brush your teeth every morning and night with fluoride toothpaste. Floss one time each day. Exercise for at least 30 minutes 5 or more days each week. Do not use any products that contain nicotine or tobacco. These products include cigarettes, chewing tobacco, and vaping devices, such as e-cigarettes. If you need help quitting, ask your health care provider. Do not use drugs. If you are sexually active, practice safe sex. Use a condom or other form of protection to prevent STIs. Find healthy ways to manage stress, such as: Meditation, yoga, or listening to music. Journaling. Talking to a trusted person. Spending time with friends and family. Minimize exposure to UV radiation to reduce your risk of skin cancer. Safety Always wear your seat belt while driving or riding in a vehicle. Do not drive: If you have been drinking alcohol. Do not ride with someone who has been drinking. If you have been using any mind-altering substances or drugs. While texting. When you are tired or distracted. Wear a helmet and other protective equipment during sports activities. If you have firearms in your house, make sure you follow all gun safety procedures. Seek help if you have been physically or sexually abused. What's next? Go to your health care provider once a year for an annual wellness visit. Ask your health care provider how often you should have your eyes and teeth checked. Stay up to date on all vaccines. This information is not intended to replace advice given to you by your health care provider. Make sure you discuss any questions you have with your health care provider. Document Revised: 10/03/2020 Document Reviewed: 10/03/2020 Elsevier Patient Education  [place].

## 2022-08-05 ENCOUNTER — Ambulatory Visit: Payer: Medicare (Managed Care) | Admitting: Family

## 2022-08-06 ENCOUNTER — Ambulatory Visit: Payer: Medicaid Other | Admitting: Family Medicine

## 2022-09-18 ENCOUNTER — Other Ambulatory Visit: Payer: Self-pay | Admitting: Family

## 2022-09-18 DIAGNOSIS — R509 Fever, unspecified: Secondary | ICD-10-CM

## 2022-09-18 DIAGNOSIS — Z20822 Contact with and (suspected) exposure to covid-19: Secondary | ICD-10-CM

## 2023-05-24 ENCOUNTER — Other Ambulatory Visit: Payer: Self-pay | Admitting: Family

## 2023-05-24 DIAGNOSIS — R509 Fever, unspecified: Secondary | ICD-10-CM

## 2023-05-24 DIAGNOSIS — Z20822 Contact with and (suspected) exposure to covid-19: Secondary | ICD-10-CM

## 2023-05-25 MED ORDER — FLUTICASONE PROPIONATE 50 MCG/ACT NA SUSP: 2.0000 | Freq: Every day | NASAL | 0 refills | Status: AC

## 2023-05-25 NOTE — Addendum Note (Signed)
Addended by: Julious Payer D on: 05/25/2023 09:27 AM   Modules accepted: Orders

## 2023-05-25 NOTE — Telephone Encounter (Signed)
Appt scheduled for CPE 07/24/2023, Please send refill to pharm

## 2023-05-25 NOTE — Telephone Encounter (Signed)
William Galvan NTBS Last OV 03/12/22 NO RF sent to pharmacy last OV greater than a year

## 2023-07-24 ENCOUNTER — Encounter: Payer: Medicaid Other | Admitting: Family

## 2023-08-29 ENCOUNTER — Other Ambulatory Visit: Payer: Self-pay | Admitting: Family

## 2023-08-29 DIAGNOSIS — Z20822 Contact with and (suspected) exposure to covid-19: Secondary | ICD-10-CM

## 2023-08-29 DIAGNOSIS — R509 Fever, unspecified: Secondary | ICD-10-CM

## 2023-08-31 NOTE — Telephone Encounter (Signed)
 Mother said he doesn't need flonase 

## 2023-08-31 NOTE — Telephone Encounter (Signed)
 [place] NTBS last OV 03/12/22 NO RF sent to pharmacy last OV greater than a year
